# Patient Record
Sex: Female | Born: 1960 | Race: White | Hispanic: No | Smoking: Current every day smoker
Health system: Southern US, Community
[De-identification: ages and names within clinical notes are randomized; demographics above are authoritative.]

## PROBLEM LIST (undated history)

## (undated) DIAGNOSIS — M1990 Unspecified osteoarthritis, unspecified site: Secondary | ICD-10-CM

## (undated) DIAGNOSIS — F329 Major depressive disorder, single episode, unspecified: Secondary | ICD-10-CM

## (undated) DIAGNOSIS — I82409 Acute embolism and thrombosis of unspecified deep veins of unspecified lower extremity: Secondary | ICD-10-CM

## (undated) DIAGNOSIS — E785 Hyperlipidemia, unspecified: Secondary | ICD-10-CM

## (undated) DIAGNOSIS — K259 Gastric ulcer, unspecified as acute or chronic, without hemorrhage or perforation: Secondary | ICD-10-CM

## (undated) DIAGNOSIS — F419 Anxiety disorder, unspecified: Secondary | ICD-10-CM

## (undated) DIAGNOSIS — N200 Calculus of kidney: Secondary | ICD-10-CM

## (undated) DIAGNOSIS — K219 Gastro-esophageal reflux disease without esophagitis: Secondary | ICD-10-CM

## (undated) DIAGNOSIS — I10 Essential (primary) hypertension: Secondary | ICD-10-CM

## (undated) DIAGNOSIS — F32A Depression, unspecified: Secondary | ICD-10-CM

## (undated) HISTORY — DX: Gastric ulcer, unspecified as acute or chronic, without hemorrhage or perforation: K25.9

## (undated) HISTORY — PX: ABDOMINAL HYSTERECTOMY: SHX81

## (undated) HISTORY — DX: Unspecified osteoarthritis, unspecified site: M19.90

## (undated) HISTORY — PX: LITHOTRIPSY: SUR834

## (undated) HISTORY — DX: Hyperlipidemia, unspecified: E78.5

## (undated) HISTORY — DX: Major depressive disorder, single episode, unspecified: F32.9

## (undated) HISTORY — DX: Gastro-esophageal reflux disease without esophagitis: K21.9

## (undated) HISTORY — DX: Depression, unspecified: F32.A

## (undated) HISTORY — PX: KIDNEY SURGERY: SHX687

## (undated) HISTORY — DX: Anxiety disorder, unspecified: F41.9

---

## 2014-02-19 DIAGNOSIS — Z7901 Long term (current) use of anticoagulants: Secondary | ICD-10-CM | POA: Insufficient documentation

## 2014-02-19 DIAGNOSIS — I82409 Acute embolism and thrombosis of unspecified deep veins of unspecified lower extremity: Secondary | ICD-10-CM | POA: Insufficient documentation

## 2014-02-19 DIAGNOSIS — O2230 Deep phlebothrombosis in pregnancy, unspecified trimester: Principal | ICD-10-CM

## 2014-02-19 DIAGNOSIS — Z87442 Personal history of urinary calculi: Secondary | ICD-10-CM | POA: Insufficient documentation

## 2014-02-19 DIAGNOSIS — Z79899 Other long term (current) drug therapy: Secondary | ICD-10-CM | POA: Insufficient documentation

## 2014-02-19 DIAGNOSIS — O99330 Smoking (tobacco) complicating pregnancy, unspecified trimester: Secondary | ICD-10-CM | POA: Insufficient documentation

## 2014-02-19 DIAGNOSIS — O169 Unspecified maternal hypertension, unspecified trimester: Secondary | ICD-10-CM | POA: Insufficient documentation

## 2014-02-20 ENCOUNTER — Ambulatory Visit (HOSPITAL_COMMUNITY): Payer: Medicare Other | Attending: Emergency Medicine

## 2014-02-20 ENCOUNTER — Encounter (HOSPITAL_COMMUNITY): Payer: Self-pay | Admitting: Emergency Medicine

## 2014-02-20 ENCOUNTER — Emergency Department (HOSPITAL_COMMUNITY)
Admission: EM | Admit: 2014-02-20 | Discharge: 2014-02-20 | Disposition: A | Discharge status: Home / Self Care | Payer: Medicare Other | Attending: Emergency Medicine | Admitting: Emergency Medicine

## 2014-02-20 DIAGNOSIS — O2230 Deep phlebothrombosis in pregnancy, unspecified trimester: Secondary | ICD-10-CM

## 2014-02-20 HISTORY — DX: Calculus of kidney: N20.0

## 2014-02-20 HISTORY — DX: Acute embolism and thrombosis of unspecified deep veins of unspecified lower extremity: I82.409

## 2014-02-20 HISTORY — DX: Essential (primary) hypertension: I10

## 2014-02-20 LAB — CBC WITH DIFFERENTIAL/PLATELET
Basophils Absolute: 0.1 10*3/uL (ref 0.0–0.1)
Basophils Relative: 1 % (ref 0–1)
EOS PCT: 3 % (ref 0–5)
Eosinophils Absolute: 0.2 10*3/uL (ref 0.0–0.7)
HCT: 39.7 % (ref 36.0–46.0)
Hemoglobin: 13 g/dL (ref 12.0–15.0)
LYMPHS PCT: 30 % (ref 12–46)
Lymphs Abs: 2.6 10*3/uL (ref 0.7–4.0)
MCH: 29.3 pg (ref 26.0–34.0)
MCHC: 32.7 g/dL (ref 30.0–36.0)
MCV: 89.6 fL (ref 78.0–100.0)
Monocytes Absolute: 0.7 10*3/uL (ref 0.1–1.0)
Monocytes Relative: 8 % (ref 3–12)
NEUTROS ABS: 5 10*3/uL (ref 1.7–7.7)
Neutrophils Relative %: 58 % (ref 43–77)
PLATELETS: 188 10*3/uL (ref 150–400)
RBC: 4.43 MIL/uL (ref 3.87–5.11)
RDW: 13.4 % (ref 11.5–15.5)
WBC: 8.5 10*3/uL (ref 4.0–10.5)

## 2014-02-20 LAB — COMPREHENSIVE METABOLIC PANEL
ALT: 19 U/L (ref 0–35)
AST: 22 U/L (ref 0–37)
Albumin: 3.6 g/dL (ref 3.5–5.2)
Alkaline Phosphatase: 69 U/L (ref 39–117)
BUN: 15 mg/dL (ref 6–23)
CALCIUM: 9.1 mg/dL (ref 8.4–10.5)
CHLORIDE: 101 meq/L (ref 96–112)
CO2: 25 meq/L (ref 19–32)
Creatinine, Ser: 0.92 mg/dL (ref 0.50–1.10)
GFR calc Af Amer: 81 mL/min — ABNORMAL LOW (ref >90)
GFR, EST NON AFRICAN AMERICAN: 70 mL/min — AB (ref >90)
Glucose, Bld: 101 mg/dL — ABNORMAL HIGH (ref 70–99)
POTASSIUM: 4.1 meq/L (ref 3.7–5.3)
SODIUM: 138 meq/L (ref 137–147)
Total Bilirubin: <0.2 mg/dL — ABNORMAL LOW (ref 0.3–1.2)
Total Protein: 6.7 g/dL (ref 6.0–8.3)

## 2014-02-20 LAB — PROTIME-INR
INR: 2.2 — ABNORMAL HIGH (ref 0.00–1.49)
Prothrombin Time: 23.7 seconds — ABNORMAL HIGH (ref 11.6–15.2)

## 2014-02-20 MED ORDER — OXYCODONE-ACETAMINOPHEN 5-325 MG PO TABS: 1.0000 | ORAL_TABLET | ORAL | Status: DC | PRN

## 2014-02-20 MED ORDER — ENOXAPARIN SODIUM 60 MG/0.6ML ~~LOC~~ SOLN
60.0000 mg | Freq: Once | SUBCUTANEOUS | Status: AC
  Administered 2014-02-20: 60 mg via SUBCUTANEOUS
  Filled 2014-02-20: qty 0.6

## 2014-02-20 MED ORDER — ENOXAPARIN SODIUM 30 MG/0.3ML ~~LOC~~ SOLN: 60.0000 mg | Freq: Two times a day (BID) | SUBCUTANEOUS | Status: DC

## 2014-02-20 MED ORDER — OXYCODONE-ACETAMINOPHEN 5-325 MG PO TABS
1.0000 | ORAL_TABLET | Freq: Once | ORAL | Status: AC
  Administered 2014-02-20: 1 via ORAL

## 2014-02-20 NOTE — ED Notes (Signed)
Pt. 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 Respirations unlabored .

## 2014-02-20 NOTE — ED Notes (Signed)
Pt discharged.Vital signs stable and GCS 15 

## 2014-02-20 NOTE — Discharge Instructions (Signed)
Deep Vein Thrombosis A deep vein thrombosis (DVT) is a blood clot that develops in the deep, larger veins of the leg, arm, or pelvis. These are more dangerous than clots that might form in veins near the surface of the body. A DVT can lead to complications if the clot breaks off and travels in the bloodstream to the lungs.  A DVT can damage the valves in your leg veins, so that instead of flowing upward, the blood pools in the lower leg. This is called post-thrombotic syndrome, and it can result in pain, swelling, discoloration, and sores on the leg. CAUSES Usually, several things contribute to blood clots forming. Contributing factors include:  The flow of blood slows down.  The inside of the vein is damaged in some way.  You have a condition that makes blood clot more easily. RISK FACTORS Some people are more likely than others to develop blood clots. Risk factors include:   Older age, especially over 53 years of age.  Having a family history of blood clots or if you have already had a blot clot.  Having major or lengthy surgery. This is especially true for surgery on the hip, knee, or belly (abdomen). Hip surgery is particularly high risk.  Breaking a hip or leg.  Sitting or lying still for a long time. This includes long-distance travel, paralysis, or recovery from an illness or surgery.  Having cancer or cancer treatment.  Having a long, thin tube (catheter) placed inside a vein during a medical procedure.  Being overweight (obese).  Pregnancy and childbirth.  Hormone changes make the blood clot more easily during pregnancy.  The fetus puts pressure on the veins of the pelvis.  There is a risk of injury to veins during delivery or a caesarean. The risk is highest just after childbirth.  Medicines with the female hormone estrogen. This includes birth control pills and hormone replacement therapy.  Smoking.  Other circulation or heart problems.  SIGNS AND SYMPTOMS When  a clot forms, it can either partially or totally block the blood flow in that vein. Symptoms of a DVT can include:  Swelling of the leg or arm, especially if one side is much worse.  Warmth and redness of the leg or arm, especially if one side is much worse.  Pain in an arm or leg. If the clot is in the leg, symptoms may be more noticeable or worse when standing or walking. The symptoms of a DVT that has traveled to the lungs (pulmonary embolism, PE) usually start suddenly and include:  Shortness of breath.  Coughing.  Coughing up blood or blood-tinged phlegm.  Chest pain. The chest pain is often worse with deep breaths.  Rapid heartbeat. Anyone with these symptoms should get emergency medical treatment right away. Call your local emergency services (911 in the [place] if you have these symptoms. DIAGNOSIS If a DVT is suspected, your health care provider will take a full medical history and perform a physical exam. Tests that also may be required include:  Blood tests, including studies of the clotting properties of the blood.  Ultrasonography to see if you have clots in your legs or lungs.  X-rays to show the flow of blood when dye is injected into the veins (venography).  Studies of your lungs if you have any chest symptoms. PREVENTION  Exercise the legs regularly. Take a brisk 30-minute walk every day.  Maintain a weight that is appropriate for your height.  Avoid sitting or lying in bed  for long periods of time without moving your legs.  Women, particularly those over the age of 24 years, should consider the risks and benefits of taking estrogen medicines, including birth control pills.  Do not smoke, especially if you take estrogen medicines.  Long-distance travel can increase your risk of DVT. You should exercise your legs by walking or pumping the muscles every hour.  In-hospital prevention:  Many of the risk factors above relate to situations that exist with  hospitalization, either for illness, injury, or elective surgery.  Your health care provider will assess you for the need for venous thromboembolism prophylaxis when you are admitted to the hospital. If you are having surgery, your surgeon will assess you the day of or day after surgery.  Prevention may include medical and nonmedical measures. TREATMENT Once identified, a DVT can be treated. It can also be prevented in some circumstances. Once you have had a DVT, you may be at increased risk for a DVT in the future. The most common treatment for DVT is blood thinning (anticoagulant) medicine, which reduces the blood's tendency to clot. Anticoagulants can stop new blood clots from forming and stop old ones from growing. They cannot dissolve existing clots. Your body does this by itself over time. Anticoagulants can be given by mouth, by IV access, or by injection. Your health care provider will determine the best program for you. Other medicines or treatments that may be used are:  Heparin or related medicines (low molecular weight heparin) are usually the first treatment for a blood clot. They act quickly. However, they cannot be taken orally.  Heparin can cause a fall in a component of blood that stops bleeding and forms blood clots (platelets). You will be monitored with blood tests to be sure this does not occur.  Warfarin is an anticoagulant that can be swallowed. It takes a few days to start working, so usually heparin or related medicines are used in combination. Once warfarin is working, heparin is usually stopped.  Less commonly, clot dissolving drugs (thrombolytics) are used to dissolve a DVT. They carry a high risk of bleeding, so they are used mainly in severe cases, where your life or a limb is threatened.  Very rarely, a blood clot in the leg needs to be removed surgically.  If you are unable to take anticoagulants, your health care provider may arrange for you to have a filter placed  in a main vein in your abdomen. This filter prevents clots from traveling to your lungs. HOME CARE INSTRUCTIONS  Take all medicines prescribed by your health care provider. Only take over-the-counter or prescription medicines for pain, fever, or discomfort as directed by your health care provider.  Warfarin. Most people will continue taking warfarin after hospital discharge. Your health care provider will advise you on the length of treatment (usually 3 6 months, sometimes lifelong).  Too much and too little warfarin are both dangerous. Too much warfarin increases the risk of bleeding. Too little warfarin continues to allow the risk for blood clots. While taking warfarin, you will need to have regular blood tests to measure your blood clotting time. These blood tests usually include both the prothrombin time (PT) and international normalized ratio (INR) tests. The PT and INR results allow your health care provider to adjust your dose of warfarin. The dose can change for many reasons. It is critically important that you take warfarin exactly as prescribed, and that you have your PT and INR levels drawn exactly as directed.  Many foods, especially foods high in vitamin K, can interfere with warfarin and affect the PT and INR results. Foods high in vitamin K include spinach, kale, broccoli, cabbage, collard and turnip greens, brussel sprouts, peas, cauliflower, seaweed, and parsley as well as beef and pork liver, green tea, and soybean oil. You should eat a consistent amount of foods high in vitamin K. Avoid major changes in your diet, or notify your health care provider before changing your diet. Arrange a visit with a dietitian to answer your questions.  Many medicines can interfere with warfarin and affect the PT and INR results. You must tell your health care provider about any and all medicines you take. This includes all vitamins and supplements. Be especially cautious with aspirin and  anti-inflammatory medicines. Ask your health care provider before taking these. Do not take or discontinue any prescribed or over-the-counter medicine except on the advice of your health care provider or pharmacist.  Warfarin can have side effects, primarily excessive bruising or bleeding. You will need to hold pressure over cuts for longer than usual. Your health care provider or pharmacist will discuss other potential side effects.  Alcohol can change the body's ability to handle warfarin. It is best to avoid alcoholic drinks or consume only very small amounts while taking warfarin. Notify your health care provider if you change your alcohol intake.  Notify your dentist or other health care providers before procedures.  Activity. Ask your health care provider how soon you can go back to normal activities. It is important to stay active to prevent blood clots. If you are on anticoagulant medicine, avoid contact sports.  Exercise. It is very important to exercise. This is especially important while traveling, sitting, or standing for long periods of time. Exercise your legs by walking or by pumping the muscles frequently. Take frequent walks.  Compression stockings. These are tight elastic stockings that apply pressure to the lower legs. This pressure can help keep the blood in the legs from clotting. You may need to wear compression stockings at home to help prevent a DVT.  Do not smoke. If you smoke, quit. Ask your health care provider for help with quitting smoking.  Learn as much as you can about DVT. Knowing more about the condition should help you keep it from coming back.  Wear a medical alert bracelet or carry a medical alert card. SEEK MEDICAL CARE IF:  You notice a rapid heartbeat.  You feel weaker or more tired than usual.  You feel faint.  You notice increased bruising.  You feel your symptoms are not getting better in the time expected.  You believe you are having side  effects of medicine. SEEK IMMEDIATE MEDICAL CARE IF:  You have chest pain.  You have trouble breathing.  You have new or increased swelling or pain in one leg.  You cough up blood.  You notice blood in vomit, in a bowel movement, or in urine. MAKE SURE YOU:  Understand these instructions.  Will watch your condition.  Will get help right away if you are not doing well or get worse. Document Released: 11/25/2005 Document Revised: 09/15/2013 Document Reviewed: 08/02/2013 [facility] Patient Information 2014 [place], [place].  Venous Thromboembolism Venous thromboembolism (VTE) is a condition where a blood clot (thrombus) develops in the body. A thrombus usually occurs in a deep vein in the leg or pelvis but can occur in an upper extremity. Sometimes pieces of the thrombus can break off from its original place of development  and travel through the bloodstream to other parts of the body. When a thrombus breaks off and travels through the bloodstream, it is called an embolism. The embolism can block the blood flow in the blood vessels of other organs. There are two two serious types of VTE:  Deep vein thrombosis (DVT). A DVT is a thrombus that usually occurs in a deep vein of the lower legs, pelvis, or in an upper extremity.  Pulmonary embolism (PE). A PE occurs when an embolism has formed and traveled to the lungs. A PE can block or decrease the blood flow in one or both lungs.  Venous thromboembolism is a serious health condition that can cause disability or death. It is very important to not ignore symptoms or delay treatment.  CAUSES   A blood clot can form in a vein from different conditions. A blood clot can develop due to:  Blood flow within a vein that is sluggish or very slow.  Medical conditions that make the blood clot easily.  Vein damage. RISK FACTORS Risk factors can increase your risk of developing a blood clot. Risk factors can  include:  Smoking.  Obesity.  Age.  Immobility or sedentary lifestyle.  Sitting or standing for long periods of time.  Chronic or long-term bedrest.  Medical or past history of blood clots.  Family history of blood clots.  Hip, leg, or pelvis injury or trauma.  Major surgery, especially surgery on the hip, knee, or abdomen.  Pregnancy and childbirth.  Birth control pills and hormone replacement therapy.  Medical conditions such as  Peripheral vascular disease (PVD).  Diabetes.  Cancer. SYMPTOMS  Symptoms of VTE can depend on where the clot is located and if the clot breaks off and travels to another organ. Sometimes, there may be no symptoms.   DVT symptoms can include:  Swelling of the leg or arm, especially on one side.  Warmth and redness of the leg or arm, especially on one side.  Pain in an arm or leg. Leg pain may be more noticeable or worse when standing or walking.  PE symptoms can include:  Shortness of breath.  Coughing.  Coughing up blood or blood-tinged mucus (hemoptysis).  Chest pain or chest pain with deep breaths (pleuritic chest pain).  Apprehension, anxiety, or a feeling of impending doom.  Rapid heartbeat. A PE is a medical emergency. Call your local emergency services (911 in [place] if you have these symptoms. DIAGNOSIS  A venous thromboembolism is diagnosed by:  Looking at your medical history and risk factors. Your caregiver will perform a physical exam.  Blood tests, including blood work of how your blood clots.  Imaging tests that can detect a blood clot may be ordered. These can include:  Ultrasonography.  Computed Tomography (CT) scan.  Magnetic Resonance Imaging (MRI).  Echocardiography.  Electrocardiography. TREATMENT  Initial treatment: When a venous thromboembolism has been confirmed, initial treatment consists of using blood thinning (anticoagulant) medicines. Anticoagulant medicines affect how your blood clots  and can cause bleeding. Therefore, your blood clotting times are monitored by blood tests called prothrombin time (PT) and International Normalized Ratio (INR) when you are on anticoagulants. Typically, the anticoagulants are intravenous (IV) heparin and warfarin. IV heparin is normally started right away because IV heparin has a rapid onset of action and thins the blood quickly. Warfarin is also started with IV heparin therapy. Warfarin has a slower onset of action and takes longer to work. This overlap therapy of IV heparin and oral  warfarin is continued until PT and INR levels are therapeutic. After the PT and INR levels are therapeutic, IV heparin is discontinued and you are maintained on warfarin.  Other treatment options:  Catheter-directed thrombolysis. This is a clot-busting therapy for a DVT in which a small, flexible hollow tube (catheter) is threaded to the blood clot inside the vein. A clot-busting drug (thrombolytic) is then infused through the catheter. The thrombolytic helps to break up the clot in the vein and restore blood flow.  Direct thrombin inhibitor (DTI) medicine. A DTI is an anticoagulant that slows blood clotting. It is given through an IV.  If you cannot take an anticoagulant, a filter called an inferior vena cava filter (IVC filter) can be placed. The IVC filter is placed in a large vein, in either your leg or abdomen. An IVC filter is left in the vein permanently. The IVC filter can help prevent blood clots from going to your lungs.  Surgery. Blood clots may need to be removed surgically if other treatment options are not working or cannot be used. Types of surgery can include:  Thrombectomy.  Embolectomy.  Venous stenting.  Pain medicine (analgesic). Medicine to control pain is given in addition to the above treatment options. Home treatment:  Continued treatment at home consists of taking either warfarin or under-the-skin (subcutaneous) injections of an  anticoagulant. PREVENTION   In-hospital prevention:  Activity. Getting out of bed and walking while you are in the hospital can help prevent blood clots.  Medicines may be given to help prevent blood clots.  Sequential compression device (SCD). A SCD can help prevent blood clots in the lower legs. A compression sleeve is wrapped around each of your legs. The tubing of the sleeve is connected to a machine that pumps air into the compression sleeve. The pumping action of the sleeve helps circulate the blood in your legs.  Compression stockings. Compression stockings are tight, elastic stockings that apply pressure to the lower legs and help prevent blood from pooling in the lower legs. Compression stockings are sometimes used with SCDs.  General prevention:  Exercise regularly if you are able.  Avoid sitting or lying in bed for long periods of time without moving the legs.  Do not smoke. If you smoke, quit. Ask your caregiver for help.  Avoid exposure to smoke.  Maintain a healthy weight.  Women over the age of 63 should consider the risk of blood clots while taking birth control pills or hormone replacement therapy.  Long distance travel along with prolonged sitting and standing can increase the risk of a DVT. Exercise your legs by walking or by pumping your leg muscles every hour. HOME CARE INSTRUCTIONS   Take all medicines prescribed by your caregiver. Follow the directions carefully.  Warfarin. Most people will continue taking warfarin. Your caregiver will advise you on the length of treatment (usually 3 to 6 months, sometimes lifelong).  Too much and too little warfarin are both dangerous. Too much warfarin increases the risk of bleeding. Too little warfarin continues to allow the risk for blood clots. While taking warfarin, you will need to have regular blood tests to measure your blood clotting time. These blood tests usually include both the PT and INR tests. The PT and INR  results allow your caregiver to adjust your dose of warfarin. The dose can change for many reasons. It is critically important that you take warfarin exactly as prescribed, and that you have your PT and INR levels drawn exactly  as directed.  Many foods, especially foods high in vitamin K can interfere with warfarin and affect the PT and INR results. Foods high in vitamin K include spinach, kale, broccoli, cabbage, collard and turnip greens, brussels sprouts, peas, cauliflower, seaweed, and parsley as well as beef and pork liver, green tea, and soybean oil. You should eat a consistent amount of foods high in vitamin K. Avoid major changes in your diet, or notify your caregiver before changing your diet. Arrange a visit with a dietitian to answer your questions.  Many medicines can interfere with warfarin and affect the PT and INR results. You must tell your caregiver about any and all medicines you take, this includes all vitamins and supplements. Be especially cautious with aspirin and anti-inflammatory medicines. Do not take or discontinue any prescribed or over-the-counter medicine except on the advice of your caregiver or pharmacist.  Warfarin can have side effects, such as excessive bruising or bleeding. You will need to hold pressure over cuts for longer than usual. Your caregiver or pharmacist will discuss other potential side effects.  Avoid sports or activities that may cause injury or bleeding.  Be mindful when shaving, flossing your teeth, or handling sharp objects.  Alcohol can change the body's ability to handle warfarin. It is best to avoid alcoholic drinks or consume only very small amounts while taking warfarin. Notify your caregiver if you change your alcohol intake.  Notify your dentist or other caregivers before procedures.  Activity. Ask your caregiver how soon you can go back to normal activities if you have had a blood blot.  Exercise. It is very important to exercise and stay  active to prevent future blood clots. This is especially important while traveling, sitting, or standing for long periods of time. Exercise your legs by walking or by pumping the muscles frequently.  Compression stockings. You may need to wear compression stockings to help prevent a DVT.  Smoking. If you smoke, quit. Ask your caregiver for help with quitting smoking.  Learn as much as you can about VTE. Educating yourself can help prevent VTE from reoccurring.  Wear a medical alert bracelet or carry a medical alert card. SEEK MEDICAL CARE IF:   You feel faint or dizzy.  You feel rapid or skipped heartbeats.  You feel weaker or more tired than usual.  You feel you are not getting better in the time expected.  You believe you are having side effects from medicine.  You have joint pain.  You have abdominal pain.  You have new or increased bruising. SEEK IMMEDIATE MEDICAL CARE IF:   You vomit bright red blood or your vomit has a coffee ground type appearance.  You have bowel movements that have bright red blood or are dark or tarry in appearance.  You have bleeding from your rectum.  You have pink or bloody urine.  You develop breathing problems such as shortness of breath or pain with breathing.  You are coughing up blood.  You develop warmth, swelling, or redness in an arm or a leg.  You have chest pain.  You have a sudden, unexplained severe headache.  You have a cut that does not stop bleeding after 10 minutes.  You have a nosebleed that does not stop bleeding after 10 minutes. Document Released: 09/22/2009 Document Revised: 05/26/2012 Document Reviewed: 05/06/2012 [facility] Patient Information 2014 [place], [place].

## 2014-02-20 NOTE — ED Provider Notes (Addendum)
CSN: [id number]     Arrival date & time 02/19/14  2354 History   First MD Initiated Contact with Patient 02/20/14 0034     Chief Complaint  Patient presents with  . DVT     (Consider location/radiation/quality/duration/timing/severity/associated sxs/prior Treatment) HPI Comments: Pt with known hx of DVT, s/p ivc filter on the left / affected side, comes in with cc of leg swelling and pain. Pt has hx of coagulopathy. She comes in due to increased leg pain x 1 day. Pt states that her leg feels tight, and more swollen than usual. She is new to [place] x 1 month, and has no PCP here. [facility]. Reports that in the past she has required lovenox in addition to pain control and her comadin for active flare ups. Diagnoses with DVT when she was 16. No numbness, tingling, weakness at this time.  The history is provided by the patient.    Past Medical History  Diagnosis Date  . DVT (deep venous thrombosis)   . Hypertension   . Kidney stones    Past Surgical History  Procedure Laterality Date  . Lithotripsy    . Abdominal hysterectomy    . Kidney surgery     No family history on file. History  Substance Use Topics  . Smoking status: Current Every Day Smoker  . Smokeless tobacco: Not on file  . Alcohol Use: No   OB History   Grav Para Term Preterm Abortions TAB SAB Ect Mult Living                 Review of Systems  Constitutional: Positive for activity change.  Respiratory: Negative for shortness of breath.   Cardiovascular: Negative for chest pain.  Gastrointestinal: Negative for nausea, vomiting and abdominal pain.  Genitourinary: Negative for dysuria.  Musculoskeletal: Positive for myalgias. Negative for neck pain.  Skin: Positive for color change.  Neurological: Negative for headaches.      Allergies  Toradol  Home Medications   Current Outpatient Rx  Name  Route  Sig  Dispense  Refill  . lisinopril (PRINIVIL,ZESTRIL) 20 MG tablet   Oral   Take 20 mg by mouth  daily.         Marland Kitchen. omeprazole (PRILOSEC) 20 MG capsule   Oral   Take 20 mg by mouth daily.         Marland Kitchen. warfarin (COUMADIN) 5 MG tablet   Oral   Take 5 mg by mouth daily at 6 PM.          BP 159/79  Pulse 80  Temp(Src) 98.9 F (37.2 C) (Oral)  Resp 14  Ht 5\' 3"  (1.6 m)  Wt 133 lb (60.328 kg)  BMI 23.57 kg/m2  SpO2 98% Physical Exam  Nursing note and vitals reviewed. Constitutional: She is oriented to person, place, and time. She appears well-developed and well-nourished.  HENT:  Head: Normocephalic and atraumatic.  Eyes: EOM are normal. Pupils are equal, round, and reactive to light.  Neck: Neck supple.  Cardiovascular: Normal rate, regular rhythm and normal heart sounds.   No murmur heard. Pulmonary/Chest: Effort normal. No respiratory distress.  Abdominal: Soft. She exhibits no distension. There is no tenderness. There is no rebound and no guarding.  Musculoskeletal:  LLE unilateral swelling. Skin is pale compared to the contralateral side, however, warm. Pulse is 1+ on the LLE DP compared to the RLE DP (2+). Sensory exam - able to discriminate between sharp and dull, and is equal bilaterally.  Neurological: She is alert and oriented to person, place, and time.  Skin: Skin is warm and dry.    ED Course  Procedures (including critical care time) Labs Review Labs Reviewed  COMPREHENSIVE METABOLIC PANEL - Abnormal; Notable for the following:    Glucose, Bld 101 (*)    Total Bilirubin <0.2 (*)    GFR calc non Af Amer 70 (*)    GFR calc Af Amer 81 (*)    All other components within normal limits  PROTIME-INR - Abnormal; Notable for the following:    Prothrombin Time 23.7 (*)    INR 2.20 (*)    All other components within normal limits  CBC WITH DIFFERENTIAL   Imaging Review No results found.   EKG Interpretation None      MDM   Final diagnoses:  DVT (deep vein thrombosis) in pregnancy   Pt with hx of DVT, comes in with cc of leg swelling. No dib,  chest pain, palpitations. She has an IVC filter in place. Currently, no signs of infection, or acute complication of DVT  Will start her on lovenox, and get an outpatient [place] and numerous outpatient follow up for primary care management.  Pt is agreeable with the plan, and states that she has had same outpatient treatment options in the past.  Derwood Kaplan, MD 02/20/14 1610  Derwood Kaplan, MD 02/20/14 9604

## 2014-02-25 ENCOUNTER — Ambulatory Visit (INDEPENDENT_AMBULATORY_CARE_PROVIDER_SITE_OTHER): Payer: Medicare Other | Admitting: Family

## 2014-02-25 ENCOUNTER — Encounter: Payer: Self-pay | Admitting: Family

## 2014-02-25 VITALS — BP 168/94 | HR 89 | Ht 62.0 in | Wt 132.0 lb

## 2014-02-25 DIAGNOSIS — G8929 Other chronic pain: Secondary | ICD-10-CM

## 2014-02-25 DIAGNOSIS — Z1589 Genetic susceptibility to other disease: Secondary | ICD-10-CM | POA: Insufficient documentation

## 2014-02-25 DIAGNOSIS — E7212 Methylenetetrahydrofolate reductase deficiency: Secondary | ICD-10-CM | POA: Insufficient documentation

## 2014-02-25 DIAGNOSIS — I82509 Chronic embolism and thrombosis of unspecified deep veins of unspecified lower extremity: Secondary | ICD-10-CM

## 2014-02-25 DIAGNOSIS — M79606 Pain in leg, unspecified: Secondary | ICD-10-CM

## 2014-02-25 DIAGNOSIS — IMO0001 Reserved for inherently not codable concepts without codable children: Secondary | ICD-10-CM

## 2014-02-25 DIAGNOSIS — Z720 Tobacco use: Secondary | ICD-10-CM

## 2014-02-25 DIAGNOSIS — F17200 Nicotine dependence, unspecified, uncomplicated: Secondary | ICD-10-CM

## 2014-02-25 DIAGNOSIS — I10 Essential (primary) hypertension: Secondary | ICD-10-CM | POA: Insufficient documentation

## 2014-02-25 DIAGNOSIS — N200 Calculus of kidney: Secondary | ICD-10-CM

## 2014-02-25 DIAGNOSIS — E7210 Disorders of sulfur-bearing amino-acid metabolism, unspecified: Secondary | ICD-10-CM

## 2014-02-25 DIAGNOSIS — M79609 Pain in unspecified limb: Secondary | ICD-10-CM

## 2014-02-25 MED ORDER — RIVAROXABAN 20 MG PO TABS: 20.0000 mg | ORAL_TABLET | Freq: Every day | ORAL | Status: DC

## 2014-02-25 MED ORDER — LISINOPRIL 40 MG PO TABS: 40.0000 mg | ORAL_TABLET | Freq: Every day | ORAL | Status: DC

## 2014-02-25 MED ORDER — CYCLOBENZAPRINE HCL 10 MG PO TABS: 10.0000 mg | ORAL_TABLET | Freq: Three times a day (TID) | ORAL | Status: DC | PRN

## 2014-02-25 NOTE — Patient Instructions (Signed)
Smoking Cessation Quitting smoking is important to your health and has many advantages. However, it is not always easy to quit since nicotine is a very addictive drug. Often times, people try 3 times or more before being able to quit. This document explains the best ways for you to prepare to quit smoking. Quitting takes hard work and a lot of effort, but you can do it. ADVANTAGES OF QUITTING SMOKING  You will live longer, feel better, and live better.  Your body will feel the impact of quitting smoking almost immediately.  Within 20 minutes, blood pressure decreases. Your pulse returns to its normal level.  After 8 hours, carbon monoxide levels in the blood return to normal. Your oxygen level increases.  After 24 hours, the chance of having a heart attack starts to decrease. Your breath, hair, and body stop smelling like smoke.  After 48 hours, damaged nerve endings begin to recover. Your sense of taste and smell improve.  After 72 hours, the body is virtually free of nicotine. Your bronchial tubes relax and breathing becomes easier.  After 2 to 12 weeks, lungs can hold more air. Exercise becomes easier and circulation improves.  The risk of having a heart attack, stroke, cancer, or lung disease is greatly reduced.  After 1 year, the risk of coronary heart disease is cut in half.  After 5 years, the risk of stroke falls to the same as a nonsmoker.  After 10 years, the risk of lung cancer is cut in half and the risk of other cancers decreases significantly.  After 15 years, the risk of coronary heart disease drops, usually to the level of a nonsmoker.  If you are pregnant, quitting smoking will improve your chances of having a healthy baby.  The people you live with, especially any children, will be healthier.  You will have extra money to spend on things other than cigarettes. QUESTIONS TO THINK ABOUT BEFORE ATTEMPTING TO QUIT You may want to talk about your answers with your  caregiver.  Why do you want to quit?  If you tried to quit in the past, what helped and what did not?  What will be the most difficult situations for you after you quit? How will you plan to handle them?  Who can help you through the tough times? Your family? Friends? A caregiver?  What pleasures do you get from smoking? What ways can you still get pleasure if you quit? Here are some questions to ask your caregiver:  How can you help me to be successful at quitting?  What medicine do you think would be best for me and how should I take it?  What should I do if I need more help?  What is smoking withdrawal like? How can I get information on withdrawal? GET READY  Set a quit date.  Change your environment by getting rid of all cigarettes, ashtrays, matches, and lighters in your home, car, or work. Do not let people smoke in your home.  Review your past attempts to quit. Think about what worked and what did not. GET SUPPORT AND ENCOURAGEMENT You have a better chance of being successful if you have help. You can get support in many ways.  Tell your family, friends, and co-workers that you are going to quit and need their support. Ask them not to smoke around you.  Get individual, group, or telephone counseling and support. Programs are available at local hospitals and health centers. Call your local health department for   information about programs in your area.  Spiritual beliefs and practices may help some smokers quit.  Download a "quit meter" on your computer to keep track of quit statistics, such as how long you have gone without smoking, cigarettes not smoked, and money saved.  Get a self-help book about quitting smoking and staying off of tobacco. LEARN NEW SKILLS AND BEHAVIORS  Distract yourself from urges to smoke. Talk to someone, go for a walk, or occupy your time with a task.  Change your normal routine. Take a different route to work. Drink tea instead of coffee.  Eat breakfast in a different place.  Reduce your stress. Take a hot bath, exercise, or read a book.  Plan something enjoyable to do every day. Reward yourself for not smoking.  Explore interactive web-based programs that specialize in helping you quit. GET MEDICINE AND USE IT CORRECTLY Medicines can help you stop smoking and decrease the urge to smoke. Combining medicine with the above behavioral methods and support can greatly increase your chances of successfully quitting smoking.  Nicotine replacement therapy helps deliver nicotine to your body without the negative effects and risks of smoking. Nicotine replacement therapy includes nicotine gum, lozenges, inhalers, nasal sprays, and skin patches. Some may be available over-the-counter and others require a prescription.  Antidepressant medicine helps people abstain from smoking, but how this works is unknown. This medicine is available by prescription.  Nicotinic receptor partial agonist medicine simulates the effect of nicotine in your brain. This medicine is available by prescription. Ask your caregiver for advice about which medicines to use and how to use them based on your health history. Your caregiver will tell you what side effects to look out for if you choose to be on a medicine or therapy. Carefully read the information on the package. Do not use any other product containing nicotine while using a nicotine replacement product.  RELAPSE OR DIFFICULT SITUATIONS Most relapses occur within the first 3 months after quitting. Do not be discouraged if you start smoking again. Remember, most people try several times before finally quitting. You may have symptoms of withdrawal because your body is used to nicotine. You may crave cigarettes, be irritable, feel very hungry, cough often, get headaches, or have difficulty concentrating. The withdrawal symptoms are only temporary. They are strongest when you first quit, but they will go away within  10 14 days. To reduce the chances of relapse, try to:  Avoid drinking alcohol. Drinking lowers your chances of successfully quitting.  Reduce the amount of caffeine you consume. Once you quit smoking, the amount of caffeine in your body increases and can give you symptoms, such as a rapid heartbeat, sweating, and anxiety.  Avoid smokers because they can make you want to smoke.  Do not let weight gain distract you. Many smokers will gain weight when they quit, usually less than 10 pounds. Eat a healthy diet and stay active. You can always lose the weight gained after you quit.  Find ways to improve your mood other than smoking. FOR MORE INFORMATION  www.smokefree.gov  Document Released: 11/19/2001 Document Revised: 05/26/2012 Document Reviewed: 03/05/2012 ExitCare Patient Information 2014 [employer].  

## 2014-02-25 NOTE — Progress Notes (Signed)
Subjective:    Patient ID: Alexandra Navarro, female    DOB: 1961-07-20, 53 y.o.   MRN: [id number]  HPI [age] year WF, smoker, new patient to the practice is in today to be established. She was seen in the ED over the weekend after developing a DVT in her left lower leg and is currently on Coumadin. She has had 8 DVTs in the past on Coumadin. She has known MTHFR mutation that was diagnosed in 2005. She has chronic left leg pain from DVTs and takes Flexeril 3 times a day. She is requesting a referral to the pain clinic. Also requests a referral to urology for recurrent renal calculi. He reports having over 80 kidney stones.  Review of Systems  Constitutional: Negative.   HENT: Negative.   Respiratory: Negative.   Cardiovascular: Negative.   Gastrointestinal: Negative.   Endocrine: Negative.   Genitourinary: Negative.   Musculoskeletal: Positive for arthralgias and myalgias.       Left leg pain and cramping  Skin: Negative.   Allergic/Immunologic: Negative.   Neurological: Negative.   Hematological: Negative.   Psychiatric/Behavioral: Negative.    Past Medical History  Diagnosis Date  . DVT (deep venous thrombosis)   . Hypertension   . Kidney stones     History   Social History  . Marital Status: Divorced    Spouse Name: N/A    Number of Children: N/A  . Years of Education: N/A   Occupational History  . Not on file.   Social History Main Topics  . Smoking status: Current Every Day Smoker  . Smokeless tobacco: Not on file  . Alcohol Use: No  . Drug Use: No  . Sexual Activity: Not on file   Other Topics Concern  . Not on file   Social History Narrative  . No narrative on file    Past Surgical History  Procedure Laterality Date  . Lithotripsy    . Abdominal hysterectomy    . Kidney surgery      Family History  Problem Relation Age of Onset  . Hyperlipidemia Mother   . Hypertension Mother   . Depression Mother   . Hyperlipidemia Father   . Hypertension  Father   . Heart disease Father   . Diabetes Father     Allergies  Allergen Reactions  . Toradol [Ketorolac Tromethamine] Other (See Comments)    Kidney damage-patient has only 1 kidney    Current Outpatient Prescriptions on File Prior to Visit  Medication Sig Dispense Refill  . omeprazole (PRILOSEC) 20 MG capsule Take 20 mg by mouth daily.       No current facility-administered medications on file prior to visit.    BP 168/94  Pulse 89  Ht 5\' 2"  (1.575 m)  Wt 132 lb (59.875 kg)  BMI 24.14 kg/m2  SpO2 98%chart    Objective:   Physical Exam  Constitutional: She is oriented to person, place, and time. She appears well-developed and well-nourished.  HENT:  Right Ear: External ear normal.  Left Ear: External ear normal.  Nose: Nose normal.  Mouth/Throat: Oropharynx is clear and moist.  Eyes: Conjunctivae and EOM are normal. Pupils are equal, round, and reactive to light.  Neck: Normal range of motion. Neck supple.  Cardiovascular: Normal rate, regular rhythm and normal heart sounds.   Pulmonary/Chest: Effort normal and breath sounds normal.  Abdominal: Soft. Bowel sounds are normal. She exhibits no distension. There is no tenderness.  Musculoskeletal: Normal range of motion. She exhibits tenderness.  She exhibits no edema.  Neurological: She is alert and oriented to person, place, and time. She has normal reflexes. She displays normal reflexes. No cranial nerve deficit. Coordination normal.  Skin: Skin is warm and dry.  Psychiatric: She has a normal mood and affect.          Assessment & Plan:  Alexandra Navarro was seen today for establish care.  Diagnoses and associated orders for this visit:  Unspecified essential hypertension  DVT, recurrent, lower extremity, chronic  Tobacco abuse  MTHFR mutation  Chronic leg pain - Ambulatory referral to Pain Clinic  Recurrent kidney stones - Ambulatory referral to Urology  Other Orders - lisinopril (PRINIVIL,ZESTRIL) 40 MG  tablet; Take 1 tablet (40 mg total) by mouth daily. - cyclobenzaprine (FLEXERIL) 10 MG tablet; Take 1 tablet (10 mg total) by mouth 3 (three) times daily as needed for muscle spasms. - Rivaroxaban (XARELTO) 20 MG TABS tablet; Take 1 tablet (20 mg total) by mouth daily with supper.   Recheck in 3 weeks and sooner as needed.

## 2014-02-25 NOTE — Progress Notes (Signed)
Pre visit review using our clinic review tool, if applicable. No additional management support is needed unless otherwise documented below in the visit note. 

## 2014-02-28 ENCOUNTER — Telehealth: Payer: Self-pay | Admitting: Family

## 2014-02-28 NOTE — Telephone Encounter (Signed)
Relevant patient education mailed to patient.  

## 2014-03-10 ENCOUNTER — Telehealth: Payer: Self-pay | Admitting: Family

## 2014-03-10 MED ORDER — RIVAROXABAN 20 MG PO TABS: 20.0000 mg | ORAL_TABLET | Freq: Every day | ORAL | Status: DC

## 2014-03-10 MED ORDER — LISINOPRIL 40 MG PO TABS: 40.0000 mg | ORAL_TABLET | Freq: Every day | ORAL | Status: DC

## 2014-03-10 MED ORDER — OMEPRAZOLE 20 MG PO CPDR: 20.0000 mg | DELAYED_RELEASE_CAPSULE | Freq: Every day | ORAL | Status: DC

## 2014-03-10 NOTE — Telephone Encounter (Signed)
Scripts resent

## 2014-03-10 NOTE — Telephone Encounter (Signed)
Pt was seen on 02/25/14 and Padonda informed her that she would call in her prescriptions, pt states her pharmacy did not receive the fax, pt is needing rx lisinopril (PRINIVIL,ZESTRIL) 40 MG tablet rivaroxaban ( xarelto) 20 mg  And omeprazole (prilosec) 20 mg, pt states she will be out of xarelto on tomorrow.please send to [facility].

## 2014-03-18 ENCOUNTER — Encounter: Payer: Self-pay | Admitting: Family

## 2014-03-18 ENCOUNTER — Ambulatory Visit (INDEPENDENT_AMBULATORY_CARE_PROVIDER_SITE_OTHER): Payer: Medicare Other | Admitting: Family

## 2014-03-18 VITALS — BP 134/84 | HR 85 | Wt 134.0 lb

## 2014-03-18 DIAGNOSIS — N200 Calculus of kidney: Secondary | ICD-10-CM

## 2014-03-18 DIAGNOSIS — I10 Essential (primary) hypertension: Secondary | ICD-10-CM

## 2014-03-18 DIAGNOSIS — R350 Frequency of micturition: Secondary | ICD-10-CM

## 2014-03-18 LAB — COMPREHENSIVE METABOLIC PANEL
ALT: 10 U/L (ref 0–35)
AST: 21 U/L (ref 0–37)
Albumin: 4 g/dL (ref 3.5–5.2)
Alkaline Phosphatase: 62 U/L (ref 39–117)
BUN: 12 mg/dL (ref 6–23)
CO2: 30 meq/L (ref 19–32)
CREATININE: 1.2 mg/dL (ref 0.4–1.2)
Calcium: 9.5 mg/dL (ref 8.4–10.5)
Chloride: 102 mEq/L (ref 96–112)
GFR: 51.77 mL/min — AB (ref >60.00)
Glucose, Bld: 94 mg/dL (ref 70–99)
Potassium: 4.7 mEq/L (ref 3.5–5.1)
SODIUM: 140 meq/L (ref 135–145)
TOTAL PROTEIN: 7 g/dL (ref 6.0–8.3)
Total Bilirubin: 0.5 mg/dL (ref 0.3–1.2)

## 2014-03-18 LAB — POCT URINALYSIS DIPSTICK
BILIRUBIN UA: NEGATIVE
Glucose, UA: NEGATIVE
KETONES UA: NEGATIVE
Nitrite, UA: NEGATIVE
PH UA: 5.5
Spec Grav, UA: >=1.03
Urobilinogen, UA: 0.2

## 2014-03-18 MED ORDER — CYCLOBENZAPRINE HCL 10 MG PO TABS
10.0000 mg | ORAL_TABLET | Freq: Three times a day (TID) | ORAL | Status: AC | PRN
Start: 2014-03-18 — End: ?

## 2014-03-18 MED ORDER — OMEPRAZOLE 20 MG PO CPDR: 20.0000 mg | DELAYED_RELEASE_CAPSULE | Freq: Every day | ORAL | Status: AC

## 2014-03-18 MED ORDER — RIVAROXABAN 20 MG PO TABS: 20.0000 mg | ORAL_TABLET | Freq: Every day | ORAL | Status: DC

## 2014-03-18 MED ORDER — LISINOPRIL 40 MG PO TABS: 40.0000 mg | ORAL_TABLET | Freq: Every day | ORAL | Status: AC

## 2014-03-18 MED ORDER — HYDROCODONE-ACETAMINOPHEN 10-325 MG PO TABS: 1.0000 | ORAL_TABLET | Freq: Three times a day (TID) | ORAL | Status: DC | PRN

## 2014-03-18 NOTE — Patient Instructions (Signed)
Kidney Stones  Kidney stones (urolithiasis) are deposits that form inside your kidneys. The intense pain is caused by the stone moving through the urinary tract. When the stone moves, the ureter goes into spasm around the stone. The stone is usually passed in the urine.   CAUSES   · A disorder that makes certain neck glands produce too much parathyroid hormone (primary hyperparathyroidism).  · A buildup of uric acid crystals, similar to gout in your joints.  · Narrowing (stricture) of the ureter.  · A kidney obstruction present at birth (congenital obstruction).  · Previous surgery on the kidney or ureters.  · Numerous kidney infections.  SYMPTOMS   · Feeling sick to your stomach (nauseous).  · Throwing up (vomiting).  · Blood in the urine (hematuria).  · Pain that usually spreads (radiates) to the groin.  · Frequency or urgency of urination.  DIAGNOSIS   · Taking a history and physical exam.  · Blood or urine tests.  · CT scan.  · Occasionally, an examination of the inside of the urinary bladder (cystoscopy) is performed.  TREATMENT   · Observation.  · Increasing your fluid intake.  · Extracorporeal shock wave lithotripsy This is a noninvasive procedure that uses shock waves to break up kidney stones.  · Surgery may be needed if you have severe pain or persistent obstruction. There are various surgical procedures. Most of the procedures are performed with the use of small instruments. Only small incisions are needed to accommodate these instruments, so recovery time is minimized.  The size, location, and chemical composition are all important variables that will determine the proper choice of action for you. Talk to your health care provider to better understand your situation so that you will minimize the risk of injury to yourself and your kidney.   HOME CARE INSTRUCTIONS   · Drink enough water and fluids to keep your urine clear or pale yellow. This will help you to pass the stone or stone fragments.  · Strain  all urine through the provided strainer. Keep all particulate matter and stones for your health care provider to see. The stone causing the pain may be as small as a grain of salt. It is very important to use the strainer each and every time you pass your urine. The collection of your stone will allow your health care provider to analyze it and verify that a stone has actually passed. The stone analysis will often identify what you can do to reduce the incidence of recurrences.  · Only take over-the-counter or prescription medicines for pain, discomfort, or fever as directed by your health care provider.  · Make a follow-up appointment with your health care provider as directed.  · Get follow-up X-rays if required. The absence of pain does not always mean that the stone has passed. It may have only stopped moving. If the urine remains completely obstructed, it can cause loss of kidney function or even complete destruction of the kidney. It is your responsibility to make sure X-rays and follow-ups are completed. Ultrasounds of the kidney can show blockages and the status of the kidney. Ultrasounds are not associated with any radiation and can be performed easily in a matter of minutes.  SEEK MEDICAL CARE IF:  · You experience pain that is progressive and unresponsive to any pain medicine you have been prescribed.  SEEK IMMEDIATE MEDICAL CARE IF:   · Pain cannot be controlled with the prescribed medicine.  · You have a fever   or shaking chills.  · The severity or intensity of pain increases over 18 hours and is not relieved by pain medicine.  · You develop a new onset of abdominal pain.  · You feel faint or pass out.  · You are unable to urinate.  MAKE SURE YOU:   · Understand these instructions.  · Will watch your condition.  · Will get help right away if you are not doing well or get worse.  Document Released: 11/25/2005 Document Revised: 07/28/2013 Document Reviewed: 04/28/2013  ExitCare® Patient Information ©2014  [employer].

## 2014-03-18 NOTE — Progress Notes (Signed)
Subjective:    Patient ID: Alexandra Navarro, female    DOB: 30-Apr-1961, 53 y.o.   MRN: [id number]  HPI  53 year old caucasian female,smoker presenting for a follow-up of HTN, DVT, and kidney stones.  She was seen on 02/25/14 after being hospitalized for a DVT.  She has been taking medications as prescribed.  She is complaining of pain in lower back on left side and blood in her urine and feels she may have another kidney stone. She has had numerous stones in the past.  She is tolerating Xarelto well.    Review of Systems  Constitutional: Negative.   HENT: Negative.   Respiratory: Negative.   Cardiovascular: Negative.   Gastrointestinal: Negative.   Genitourinary: Positive for hematuria.  Musculoskeletal: Positive for back pain.  Skin: Negative.   Neurological: Negative for dizziness and headaches.  Hematological: Negative.  Does not bruise/bleed easily.   Past Medical History  Diagnosis Date  . DVT (deep venous thrombosis)   . Hypertension   . Kidney stones     History   Social History  . Marital Status: Divorced    Spouse Name: N/A    Number of Children: N/A  . Years of Education: N/A   Occupational History  . Not on file.   Social History Main Topics  . Smoking status: Current Every Day Smoker  . Smokeless tobacco: Not on file  . Alcohol Use: No  . Drug Use: No  . Sexual Activity: Not on file   Other Topics Concern  . Not on file   Social History Narrative  . No narrative on file    Past Surgical History  Procedure Laterality Date  . Lithotripsy    . Abdominal hysterectomy    . Kidney surgery      Family History  Problem Relation Age of Onset  . Hyperlipidemia Mother   . Hypertension Mother   . Depression Mother   . Hyperlipidemia Father   . Hypertension Father   . Heart disease Father   . Diabetes Father     Allergies  Allergen Reactions  . Toradol [Ketorolac Tromethamine] Other (See Comments)    Kidney damage-patient has only 1 kidney     No current outpatient prescriptions on file prior to visit.   No current facility-administered medications on file prior to visit.    BP 134/84  Pulse 85  Wt 134 lb (60.782 kg)chart    Objective:   Physical Exam  Constitutional: She is oriented to person, place, and time. She appears well-developed and well-nourished.  Neck: Normal range of motion. Neck supple.  Cardiovascular: Normal rate, regular rhythm and normal heart sounds.   Pulmonary/Chest: Effort normal and breath sounds normal. No respiratory distress.  Abdominal: Soft. Bowel sounds are normal.  Musculoskeletal: Normal range of motion.  Back pain at 7, on scale of 1-10.  Neurological: She is alert and oriented to person, place, and time.  Skin: Skin is warm and dry.          Assessment & Plan:   Alexandra Navarro was seen today for follow-up.  Diagnoses and associated orders for this visit:  Unspecified essential hypertension - CMP  Recurrent kidney stones - POCT urinalysis dipstick - Ambulatory referral to Pain Clinic - Urine culture  Other Orders - Rivaroxaban (XARELTO) 20 MG TABS tablet; Take 1 tablet (20 mg total) by mouth daily with supper. - omeprazole (PRILOSEC) 20 MG capsule; Take 1 capsule (20 mg total) by mouth daily. - cyclobenzaprine (FLEXERIL) 10 MG  tablet; Take 1 tablet (10 mg total) by mouth 3 (three) times daily as needed for muscle spasms. - lisinopril (PRINIVIL,ZESTRIL) 40 MG tablet; Take 1 tablet (40 mg total) by mouth daily. - HYDROcodone-acetaminophen (NORCO) 10-325 MG per tablet; Take 1 tablet by mouth every 8 (eight) hours as needed.    1. Continue current medications as scheduled. 2.Obtain labs that include kidney function and UA.  3. Norco for pain PRN.  4. Patient to follow-up with  [facility] 5.Referral for pain clinic.

## 2014-03-18 NOTE — Progress Notes (Signed)
Pre visit review using our clinic review tool, if applicable. No additional management support is needed unless otherwise documented below in the visit note. 

## 2014-03-21 ENCOUNTER — Telehealth: Payer: Self-pay

## 2014-03-21 ENCOUNTER — Encounter: Payer: Self-pay | Admitting: Family

## 2014-03-21 ENCOUNTER — Telehealth: Payer: Self-pay | Admitting: Family

## 2014-03-21 LAB — URINE CULTURE: Colony Count: >=100000

## 2014-03-21 MED ORDER — SULFAMETHOXAZOLE-TMP DS 800-160 MG PO TABS: 1.0000 | ORAL_TABLET | Freq: Two times a day (BID) | ORAL | Status: DC

## 2014-03-21 NOTE — Telephone Encounter (Signed)
Pt aware and abx sent

## 2014-03-21 NOTE — Telephone Encounter (Signed)
Relevant patient education mailed to patient.  

## 2014-03-21 NOTE — Telephone Encounter (Signed)
Message copied by Beverely LowFRAZIER, Dustin Bumbaugh L on Mon Mar 21, 2014  2:25 PM ------      Message from: [place], [place]      Created: Fri Mar 18, 2014  4:21 PM       Positive blood consistent with possible stone ------

## 2014-04-04 ENCOUNTER — Encounter: Payer: Self-pay | Admitting: General Practice

## 2014-04-07 ENCOUNTER — Telehealth: Payer: Self-pay

## 2014-04-07 ENCOUNTER — Ambulatory Visit: Payer: Medicare Other | Admitting: Family

## 2014-04-07 NOTE — Telephone Encounter (Signed)
Spoke with pt concerning her appointment today for ? Kidney stones. Pt and Padonda already know that pt has a kidney stone. Pt wanted to be seen to see if Oran Reinadonda would refill her Hydrocodone Rx until she sees pain clinic on May 7. [facility] not [facility] another [facility]. Pt's appointment canceled

## 2014-04-29 ENCOUNTER — Other Ambulatory Visit: Payer: Self-pay | Admitting: Urology

## 2014-04-29 DIAGNOSIS — N1330 Unspecified hydronephrosis: Secondary | ICD-10-CM

## 2014-05-09 ENCOUNTER — Other Ambulatory Visit: Payer: Self-pay | Admitting: Pain Medicine

## 2014-05-09 DIAGNOSIS — M545 Low back pain, unspecified: Secondary | ICD-10-CM

## 2014-05-11 ENCOUNTER — Other Ambulatory Visit: Payer: Medicare Other

## 2014-05-27 ENCOUNTER — Ambulatory Visit (HOSPITAL_COMMUNITY)
Admission: RE | Admit: 2014-05-27 | Discharge: 2014-05-27 | Disposition: A | Discharge status: Home / Self Care | Payer: Medicare Other | Source: Ambulatory Visit | Attending: Urology | Admitting: Urology

## 2014-05-27 DIAGNOSIS — N1330 Unspecified hydronephrosis: Secondary | ICD-10-CM | POA: Insufficient documentation

## 2014-05-27 MED ORDER — FUROSEMIDE 10 MG/ML IJ SOLN
40.0000 mg | Freq: Once | INTRAMUSCULAR | Status: AC
  Administered 2014-05-27: 30 mg via INTRAVENOUS
  Filled 2014-05-27: qty 4

## 2014-05-27 MED ORDER — TECHNETIUM TC 99M MERTIATIDE
16.0000 | Freq: Once | INTRAVENOUS | Status: AC | PRN
  Administered 2014-05-27: 16 via INTRAVENOUS

## 2014-05-30 ENCOUNTER — Ambulatory Visit (INDEPENDENT_AMBULATORY_CARE_PROVIDER_SITE_OTHER): Payer: Medicare Other | Admitting: Gastroenterology

## 2014-05-30 ENCOUNTER — Other Ambulatory Visit (INDEPENDENT_AMBULATORY_CARE_PROVIDER_SITE_OTHER): Payer: Medicare Other

## 2014-05-30 ENCOUNTER — Encounter: Payer: Self-pay | Admitting: Gastroenterology

## 2014-05-30 VITALS — BP 120/74 | HR 80 | Ht 62.0 in | Wt 132.4 lb

## 2014-05-30 DIAGNOSIS — K219 Gastro-esophageal reflux disease without esophagitis: Secondary | ICD-10-CM

## 2014-05-30 DIAGNOSIS — R932 Abnormal findings on diagnostic imaging of liver and biliary tract: Secondary | ICD-10-CM

## 2014-05-30 LAB — HEPATIC FUNCTION PANEL
ALBUMIN: 4.3 g/dL (ref 3.5–5.2)
ALT: 14 U/L (ref 0–35)
AST: 22 U/L (ref 0–37)
Alkaline Phosphatase: 49 U/L (ref 39–117)
Bilirubin, Direct: 0.1 mg/dL (ref 0.0–0.3)
TOTAL PROTEIN: 7 g/dL (ref 6.0–8.3)
Total Bilirubin: 0.4 mg/dL (ref 0.2–1.2)

## 2014-05-30 LAB — BASIC METABOLIC PANEL
BUN: 19 mg/dL (ref 6–23)
CALCIUM: 9.3 mg/dL (ref 8.4–10.5)
CO2: 31 meq/L (ref 19–32)
CREATININE: 1.2 mg/dL (ref 0.4–1.2)
Chloride: 103 mEq/L (ref 96–112)
GFR: 48.81 mL/min — ABNORMAL LOW (ref >60.00)
GLUCOSE: 90 mg/dL (ref 70–99)
Potassium: 4.7 mEq/L (ref 3.5–5.1)
Sodium: 139 mEq/L (ref 135–145)

## 2014-05-30 NOTE — Progress Notes (Signed)
    History of Present Illness: This is a 53 year old female underwent evaluation for kidney stone with a non contrasted abdominal/pelvic CT which revealed a dilated common bile duct at 1.5 cm and mildly dilated intrahepatic ducts. No other gastrointestinal abnormalities were noted. LFTs performed in March were normal. No GI symptoms. She states this same problem was evaluated 2 [place] ago in [place] and nothing was found. Denies weight loss, abdominal pain, constipation, diarrhea, change in stool caliber, melena, hematochezia, nausea, vomiting, dysphagia, reflux symptoms, chest pain.  Review of Systems: Pertinent positive and negative review of systems were noted in the above HPI section. All other review of systems were otherwise negative.  Current Medications, Allergies, Past Medical History, Past Surgical History, Family History and Social History were reviewed in [facility] electronic medical record.  Physical Exam: General: Well developed , well nourished, no acute distress Head: Normocephalic and atraumatic Eyes:  sclerae anicteric, EOMI Ears: Normal auditory acuity Mouth: No deformity or lesions Neck: Supple, no masses or thyromegaly Lungs: Clear throughout to auscultation Heart: Regular rate and rhythm; no murmurs, rubs or bruits Abdomen: Soft, non tender and non distended. No masses, hepatosplenomegaly or hernias noted. Normal Bowel sounds Musculoskeletal: Symmetrical with no gross deformities  Skin: No lesions on visible extremities Pulses:  Normal pulses noted Extremities: No clubbing, cyanosis, edema or deformities noted Neurological: Alert oriented x 4, grossly nonfocal Cervical Nodes:  No significant cervical adenopathy Inguinal Nodes: No significant inguinal adenopathy Psychological:  Alert and cooperative. Normal mood and affect  Assessment and Recommendations:  1. Dilated bile duct. Etiology unclear. Rule out pancreatic lesion, biliary stricture or CBD stone.  Repeat LFTs. Schedule MRI/MRCP for further evaluation. Attempt to obtain records from prior evaluation in [place].  2. Colorectal cancer screening, average risk. Will discuss elective colonoscopy after problem #1 has been fully evaluated.  3GERD. Well controlled on omeprazole 20 mg daily and antireflux measures.

## 2014-05-30 NOTE — Patient Instructions (Addendum)
You have been given a separate informational sheet regarding your tobacco use, the importance of quitting and local resources to help you quit.  Your physician has requested that you go to the basement for the following lab work before leaving today:Bmet, LFT's.   You have been scheduled for an [facility]. Your appointment time is 12:00pm. Please arrive 15 minutes prior to your appointment time for registration purposes. Please make certain not to have anything to eat or drink 6 hours prior to your test. In addition, if you have any metal in your body, have a pacemaker or defibrillator, please be sure to let your ordering physician know. This test typically takes 45 minutes to 1 hour to complete.  Thank you for choosing me and [facility].  Venita LickMalcolm T. Pleas KochStark, Jr., MD., Clementeen GrahamFACG

## 2014-06-04 ENCOUNTER — Encounter (HOSPITAL_COMMUNITY): Payer: Self-pay | Admitting: Emergency Medicine

## 2014-06-04 ENCOUNTER — Emergency Department (HOSPITAL_COMMUNITY)
Admission: EM | Admit: 2014-06-04 | Discharge: 2014-06-05 | Discharge status: Against Medical Advice | Payer: Medicare Other | Attending: Emergency Medicine | Admitting: Emergency Medicine

## 2014-06-04 DIAGNOSIS — I10 Essential (primary) hypertension: Secondary | ICD-10-CM | POA: Diagnosis not present

## 2014-06-04 DIAGNOSIS — Z87442 Personal history of urinary calculi: Secondary | ICD-10-CM | POA: Insufficient documentation

## 2014-06-04 DIAGNOSIS — M79609 Pain in unspecified limb: Secondary | ICD-10-CM | POA: Insufficient documentation

## 2014-06-04 DIAGNOSIS — F17200 Nicotine dependence, unspecified, uncomplicated: Secondary | ICD-10-CM | POA: Insufficient documentation

## 2014-06-04 DIAGNOSIS — Z86718 Personal history of other venous thrombosis and embolism: Secondary | ICD-10-CM | POA: Diagnosis not present

## 2014-06-04 NOTE — ED Notes (Signed)
Pt c/o L leg cramping onset 520 am, pt states she has hx of dvt in same extremity with decreased blood flow. Strong pedal pulse noted.

## 2014-06-05 NOTE — ED Notes (Signed)
Multiple attempts made to find patient, unable to locate at this time

## 2014-06-06 ENCOUNTER — Emergency Department (HOSPITAL_COMMUNITY)
Admission: EM | Admit: 2014-06-06 | Discharge: 2014-06-06 | Disposition: A | Discharge status: Home / Self Care | Payer: Medicare Other | Attending: Emergency Medicine | Admitting: Emergency Medicine

## 2014-06-06 ENCOUNTER — Encounter (HOSPITAL_COMMUNITY): Payer: Self-pay | Admitting: Emergency Medicine

## 2014-06-06 DIAGNOSIS — K259 Gastric ulcer, unspecified as acute or chronic, without hemorrhage or perforation: Secondary | ICD-10-CM | POA: Diagnosis not present

## 2014-06-06 DIAGNOSIS — Z87442 Personal history of urinary calculi: Secondary | ICD-10-CM | POA: Insufficient documentation

## 2014-06-06 DIAGNOSIS — Z862 Personal history of diseases of the blood and blood-forming organs and certain disorders involving the immune mechanism: Secondary | ICD-10-CM | POA: Diagnosis not present

## 2014-06-06 DIAGNOSIS — Z8639 Personal history of other endocrine, nutritional and metabolic disease: Secondary | ICD-10-CM | POA: Insufficient documentation

## 2014-06-06 DIAGNOSIS — Z8659 Personal history of other mental and behavioral disorders: Secondary | ICD-10-CM | POA: Diagnosis not present

## 2014-06-06 DIAGNOSIS — Z79899 Other long term (current) drug therapy: Secondary | ICD-10-CM | POA: Insufficient documentation

## 2014-06-06 DIAGNOSIS — F17200 Nicotine dependence, unspecified, uncomplicated: Secondary | ICD-10-CM | POA: Diagnosis not present

## 2014-06-06 DIAGNOSIS — R252 Cramp and spasm: Secondary | ICD-10-CM | POA: Insufficient documentation

## 2014-06-06 DIAGNOSIS — K219 Gastro-esophageal reflux disease without esophagitis: Secondary | ICD-10-CM | POA: Insufficient documentation

## 2014-06-06 DIAGNOSIS — Z7901 Long term (current) use of anticoagulants: Secondary | ICD-10-CM | POA: Insufficient documentation

## 2014-06-06 DIAGNOSIS — M79609 Pain in unspecified limb: Secondary | ICD-10-CM | POA: Diagnosis present

## 2014-06-06 DIAGNOSIS — Z8739 Personal history of other diseases of the musculoskeletal system and connective tissue: Secondary | ICD-10-CM | POA: Diagnosis not present

## 2014-06-06 DIAGNOSIS — I10 Essential (primary) hypertension: Secondary | ICD-10-CM | POA: Diagnosis not present

## 2014-06-06 DIAGNOSIS — Z86718 Personal history of other venous thrombosis and embolism: Secondary | ICD-10-CM | POA: Insufficient documentation

## 2014-06-06 LAB — PROTIME-INR
INR: 2.49 — ABNORMAL HIGH (ref 0.00–1.49)
PROTHROMBIN TIME: 26.9 s — AB (ref 11.6–15.2)

## 2014-06-06 MED ORDER — OXYCODONE-ACETAMINOPHEN 5-325 MG PO TABS
2.0000 | ORAL_TABLET | Freq: Once | ORAL | Status: AC
  Administered 2014-06-06: 2 via ORAL
  Filled 2014-06-06: qty 2

## 2014-06-06 NOTE — ED Provider Notes (Signed)
CSN: [id number]     Arrival date & time 06/06/14  0017 History   First MD Initiated Contact with Patient 06/06/14 [phone]     Chief Complaint  Patient presents with  . 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 She is anticoagulated with warfarin. She presents with complaints of aching pain of the posterior aspect of her left lower leg. This has been going on since around 10 AM yesterday. Patient denies any injury. She says it feels like she has a charley horse. No fever. Patient reports compliance with warfarin. She denies paresthesias and motor weakness.  Patient rates her pain at 8 on a 0-to-10 scale. It is nonradiating.  Of note, the patient has an IVC filter Past Medical History  Diagnosis Date  . DVT (deep venous thrombosis)   . Hypertension   . Kidney stones   . Anxiety   . Arthritis   . Depression   . GERD (gastroesophageal reflux disease)   . Hyperlipidemia   . Gastric ulcer    Past Surgical History  Procedure Laterality Date  . Lithotripsy    . Abdominal hysterectomy    . Kidney surgery     Family History  Problem Relation Age of Onset  . Hyperlipidemia Mother   . Hypertension Mother   . Depression Mother   . Hyperlipidemia Father   . Hypertension Father   . Heart disease Father   . Diabetes Father    History  Substance Use Topics  . Smoking status: Current Every Day Smoker  . Smokeless tobacco: Not on file  . Alcohol Use: No   OB History   Grav Para Term Preterm Abortions TAB SAB Ect Mult Living                 Review of Systems Ten point review of symptoms performed and is negative with the exception of symptoms noted above.    Allergies  Sulfa antibiotics and Toradol  Home Medications   Prior to Admission medications   Medication Sig Start Date End Date  Taking? Authorizing Provider  cyclobenzaprine (FLEXERIL) 10 MG tablet Take 1 tablet (10 mg total) by mouth 3 (three) times daily as needed for muscle spasms. 03/18/14   Baker PieriniPadonda B Campbell, FNP  lisinopril (PRINIVIL,ZESTRIL) 40 MG tablet Take 1 tablet (40 mg total) by mouth daily. 03/18/14   Baker PieriniPadonda B Campbell, FNP  methadone (DOLOPHINE) 10 MG/ML solution Take 63 mg by mouth daily.    Historical Provider, MD  omeprazole (PRILOSEC) 20 MG capsule Take 1 capsule (20 mg total) by mouth daily. 03/18/14   Baker PieriniPadonda B Campbell, FNP  warfarin (COUMADIN) 5 MG tablet Take 5 mg by mouth daily.    Historical Provider, MD   BP 152/90  Pulse 76  Temp(Src) 98 F (36.7 C) (Oral)  Resp 16  SpO2 100% Physical Exam Gen: well developed and well nourished appearing Head: NCAT Eyes: PERL, EOMI Nose: no epistaixis or rhinorrhea Mouth/throat: mucosa is moist and pink Neck: normal to inspection Lungs: CTA B, no wheezing, rhonchi or rales CV: RRR, no murmur, extremities appear well perfused.  Abd: soft, notender, nondistended Back: normal to inspection Skin: warm and dry Ext: left leg is normal to inspection with the exception of fairly diffuse varicosities, there is palpable tension of the left gastrocnemius m. With trigger points and worsening of pain with palpation and with  dorsiflexion of the left foot. Neurovascular exam is wnl - sensation intact to light touch, DP pulses symmetric, cap refill < 2s.  Neuro: CN ii-xii grossly intact, no focal deficits Psyche; tearful affect,  calm and cooperative.   ED Course  Procedures (including critical care time) Labs Review Labs Reviewed  PROTIME-INR - Abnormal; Notable for the following:    Prothrombin Time 26.9 (*)    INR 2.49 (*)    All other components within normal limits      MDM   Muscle spasm of left lower leg. Improved after tx with Percocet in ED. INR is therapeutic. The patient is stable for discharge with plan for outpatient f/u.     Brandt LoosenJulie Manly,  MD 06/06/14 [phone]

## 2014-06-06 NOTE — ED Notes (Signed)
Pt. reports entire left leg pain for 2 days denies injury or fall , ambulatory / respirations unlabored , pedal pulse present .

## 2014-06-06 NOTE — Discharge Instructions (Signed)

## 2014-06-08 ENCOUNTER — Ambulatory Visit (HOSPITAL_COMMUNITY): Payer: Medicare Other

## 2014-06-13 ENCOUNTER — Ambulatory Visit (HOSPITAL_COMMUNITY): Admission: RE | Admit: 2014-06-13 | Payer: Medicare Other | Source: Ambulatory Visit

## 2014-06-21 ENCOUNTER — Other Ambulatory Visit: Payer: Self-pay | Admitting: Gastroenterology

## 2014-06-21 DIAGNOSIS — R932 Abnormal findings on diagnostic imaging of liver and biliary tract: Secondary | ICD-10-CM

## 2014-06-22 ENCOUNTER — Ambulatory Visit (HOSPITAL_COMMUNITY): Admission: RE | Admit: 2014-06-22 | Payer: Medicare Other | Source: Ambulatory Visit

## 2014-06-30 IMAGING — NM NM RENAL IMAGING FLOW W/ PHARM
2 series · 12 of 12 positions shown · non-contrast
Comparison: 03/30/2014

CLINICAL DATA: Evaluate hydronephrosis of the right kidney

EXAM:
NUCLEAR MEDICINE RENAL SCAN WITH DIURETIC ADMINISTRATION
TECHNIQUE: Radionuclide angiographic and sequential renal images were obtained
after intravenous injection of radiopharmaceutical. Imaging was
continued during slow intravenous injection of Lasix approximately
15 minutes after the start of the examination.
RADIOPHARMACEUTICALS:  16.0 mCi Wc-11m MAG3

[Series 1: re renal qualitative · 9.51mm/px · 6 of 130 frames shown (1 of 2)]
[frame 11/130]
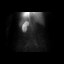
[frame 33/130]
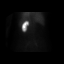
[frame 55/130]
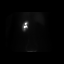
[frame 76/130]
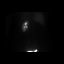
[frame 98/130]
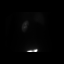
[frame 120/130]
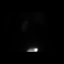

[Series 1: re renal qualitative · 9.51mm/px · 6 of 130 frames shown (2 of 2)]
[frame 11/130]
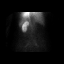
[frame 33/130]
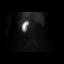
[frame 55/130]
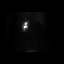
[frame 76/130]
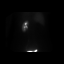
[frame 98/130]
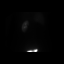
[frame 120/130]
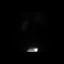

[12 of 12 positions shown; findings below may reference images not displayed]

FINDINGS: Flow: On the flow phase portion of the examination there is normal
perfusion to the left kidney. There is no at perfusion to the right
kidney..

Left renogram: Normal cortical uptake by the left kidney. On the
delayed images there is normal excretion and clearance of the
radiopharmaceutical by the left kidney.

Right renogram: Absent cortical uptake by the right kidney noted.

Differential:

Left kidney = 99.2 %

Right kidney = 0.8 %

T1/2 post Lasix :

Left kidney = 20.5 . Min

Right kidney = not applicable. Min
IMPRESSION: 1. Normally functioning left kidney.
2. Markedly diminished right renal function.
3. The split renal function is equal to 9 9.2% from the left kidney
and 0.8% from the right kidney.

## 2014-07-22 ENCOUNTER — Other Ambulatory Visit: Payer: Self-pay | Admitting: Family

## 2014-07-23 ENCOUNTER — Other Ambulatory Visit: Payer: Self-pay | Admitting: Family

## 2014-11-02 ENCOUNTER — Emergency Department (HOSPITAL_COMMUNITY): Payer: Medicare Other

## 2014-11-02 ENCOUNTER — Emergency Department (HOSPITAL_COMMUNITY)
Admission: EM | Admit: 2014-11-02 | Discharge: 2014-11-02 | Disposition: A | Discharge status: Home / Self Care | Payer: Medicare Other | Attending: Emergency Medicine | Admitting: Emergency Medicine

## 2014-11-02 ENCOUNTER — Encounter (HOSPITAL_COMMUNITY): Payer: Self-pay | Admitting: Emergency Medicine

## 2014-11-02 DIAGNOSIS — Z86718 Personal history of other venous thrombosis and embolism: Secondary | ICD-10-CM | POA: Diagnosis not present

## 2014-11-02 DIAGNOSIS — R0781 Pleurodynia: Secondary | ICD-10-CM

## 2014-11-02 DIAGNOSIS — Z8639 Personal history of other endocrine, nutritional and metabolic disease: Secondary | ICD-10-CM | POA: Diagnosis not present

## 2014-11-02 DIAGNOSIS — Z8739 Personal history of other diseases of the musculoskeletal system and connective tissue: Secondary | ICD-10-CM | POA: Insufficient documentation

## 2014-11-02 DIAGNOSIS — Z8659 Personal history of other mental and behavioral disorders: Secondary | ICD-10-CM | POA: Insufficient documentation

## 2014-11-02 DIAGNOSIS — R0789 Other chest pain: Secondary | ICD-10-CM | POA: Diagnosis not present

## 2014-11-02 DIAGNOSIS — K219 Gastro-esophageal reflux disease without esophagitis: Secondary | ICD-10-CM | POA: Insufficient documentation

## 2014-11-02 DIAGNOSIS — Z7901 Long term (current) use of anticoagulants: Secondary | ICD-10-CM | POA: Diagnosis not present

## 2014-11-02 DIAGNOSIS — Z79899 Other long term (current) drug therapy: Secondary | ICD-10-CM | POA: Insufficient documentation

## 2014-11-02 DIAGNOSIS — Z87442 Personal history of urinary calculi: Secondary | ICD-10-CM | POA: Insufficient documentation

## 2014-11-02 DIAGNOSIS — I10 Essential (primary) hypertension: Secondary | ICD-10-CM | POA: Diagnosis not present

## 2014-11-02 DIAGNOSIS — R079 Chest pain, unspecified: Secondary | ICD-10-CM | POA: Diagnosis present

## 2014-11-02 LAB — CBC WITH DIFFERENTIAL/PLATELET
BASOS PCT: 1 % (ref 0–1)
Basophils Absolute: 0.1 10*3/uL (ref 0.0–0.1)
EOS ABS: 0.2 10*3/uL (ref 0.0–0.7)
EOS PCT: 2 % (ref 0–5)
HCT: 42.1 % (ref 36.0–46.0)
HEMOGLOBIN: 13.7 g/dL (ref 12.0–15.0)
LYMPHS ABS: 2.9 10*3/uL (ref 0.7–4.0)
Lymphocytes Relative: 33 % (ref 12–46)
MCH: 29.8 pg (ref 26.0–34.0)
MCHC: 32.5 g/dL (ref 30.0–36.0)
MCV: 91.5 fL (ref 78.0–100.0)
MONO ABS: 0.7 10*3/uL (ref 0.1–1.0)
MONOS PCT: 8 % (ref 3–12)
NEUTROS PCT: 56 % (ref 43–77)
Neutro Abs: 5.1 10*3/uL (ref 1.7–7.7)
Platelets: 211 10*3/uL (ref 150–400)
RBC: 4.6 MIL/uL (ref 3.87–5.11)
RDW: 13.6 % (ref 11.5–15.5)
WBC: 8.9 10*3/uL (ref 4.0–10.5)

## 2014-11-02 LAB — PROTIME-INR
INR: 1.07 (ref 0.00–1.49)
Prothrombin Time: 14 seconds (ref 11.6–15.2)

## 2014-11-02 LAB — BASIC METABOLIC PANEL
Anion gap: 13 (ref 5–15)
BUN: 21 mg/dL (ref 6–23)
CALCIUM: 9.2 mg/dL (ref 8.4–10.5)
CO2: 23 mEq/L (ref 19–32)
CREATININE: 1.16 mg/dL — AB (ref 0.50–1.10)
Chloride: 104 mEq/L (ref 96–112)
GFR, EST AFRICAN AMERICAN: 61 mL/min — AB (ref >90)
GFR, EST NON AFRICAN AMERICAN: 52 mL/min — AB (ref >90)
GLUCOSE: 86 mg/dL (ref 70–99)
Potassium: 4.9 mEq/L (ref 3.7–5.3)
Sodium: 140 mEq/L (ref 137–147)

## 2014-11-02 MED ORDER — TECHNETIUM TC 99M DIETHYLENETRIAME-PENTAACETIC ACID: 40.0000 | Freq: Once | INTRAVENOUS | Status: DC | PRN

## 2014-11-02 MED ORDER — WARFARIN SODIUM 5 MG PO TABS
5.0000 mg | ORAL_TABLET | Freq: Once | ORAL | Status: AC
  Administered 2014-11-02: 5 mg via ORAL
  Filled 2014-11-02: qty 1

## 2014-11-02 MED ORDER — OXYCODONE-ACETAMINOPHEN 5-325 MG PO TABS: 1.0000 | ORAL_TABLET | Freq: Four times a day (QID) | ORAL | Status: DC | PRN

## 2014-11-02 MED ORDER — OXYCODONE-ACETAMINOPHEN 5-325 MG PO TABS
2.0000 | ORAL_TABLET | Freq: Once | ORAL | Status: AC
  Administered 2014-11-02: 2 via ORAL
  Filled 2014-11-02: qty 2

## 2014-11-02 MED ORDER — TECHNETIUM TO 99M ALBUMIN AGGREGATED
5.4000 | Freq: Once | INTRAVENOUS | Status: AC | PRN
Start: 2014-11-02 — End: 2014-11-02
  Administered 2014-11-02: 5 via INTRAVENOUS

## 2014-11-02 MED ORDER — WARFARIN SODIUM 2.5 MG PO TABS
2.5000 mg | ORAL_TABLET | Freq: Once | ORAL | Status: AC
  Administered 2014-11-02: 2.5 mg via ORAL
  Filled 2014-11-02: qty 1

## 2014-11-02 NOTE — ED Provider Notes (Signed)
CSN: [id number]     Arrival date & time 11/02/14  1203 History   First MD Initiated Contact with Patient 11/02/14 1219     Chief Complaint  Patient presents with  . Flank Pain     (Consider location/radiation/quality/duration/timing/severity/associated sxs/prior Treatment) HPI  Pt is a 53yo female with hx of DVT, HTN, kidney stones, anxiety, depression, arthritis, GERD, hyperlipidemia and gastric ulcer, presenting to ED with c/o left sided chest wall pain x3 days. Pt states she thinks she pulled a muscle or tendon after carrying in a large amount of groceries the other day. Denies fall or direct trauma to chest wall. Pain is aching, and sharp on occasion with certain movements, deep breathing and palpation over ribs.  States she took percocet she had left over from previous leg pain. This did help relief her pain. Denies SOB. Denies fever, chills, n/v/d. Denies any rash, redness, or bruising to area of pain.  Pt also c/o rash around wrists and ankles that is erythematous and pruritic. Rash has been present for over 3 months. She has been given prednisone by her PCP which did help the rash improve some but it never completely resolved. Pt initially believed it was poison ivy. No known allergies. No other sick contacts with similar rash.   Past Medical History  Diagnosis Date  . DVT (deep venous thrombosis)   . Hypertension   . Kidney stones   . Anxiety   . Arthritis   . Depression   . GERD (gastroesophageal reflux disease)   . Hyperlipidemia   . Gastric ulcer    Past Surgical History  Procedure Laterality Date  . Lithotripsy    . Abdominal hysterectomy    . Kidney surgery     Family History  Problem Relation Age of Onset  . Hyperlipidemia Mother   . Hypertension Mother   . Depression Mother   . Hyperlipidemia Father   . Hypertension Father   . Heart disease Father   . Diabetes Father    History  Substance Use Topics  . Smoking status: Current Every Day Smoker  .  Smokeless tobacco: Not on file  . Alcohol Use: No   OB History    No data available     Review of Systems  Constitutional: Negative for fever and chills.  Respiratory: Negative for cough and shortness of breath.   Cardiovascular: Positive for chest pain ( left chest wall). Negative for palpitations and leg swelling.  Gastrointestinal: Negative for nausea, vomiting, abdominal pain and diarrhea.  Musculoskeletal: Positive for myalgias. Negative for back pain.  All other systems reviewed and are negative.     Allergies  Sulfa antibiotics and Toradol  Home Medications   Prior to Admission medications   Medication Sig Start Date End Date Taking? Authorizing Provider  cyclobenzaprine (FLEXERIL) 10 MG tablet Take 1 tablet (10 mg total) by mouth 3 (three) times daily as needed for muscle spasms. 03/18/14  Yes Baker PieriniPadonda B Campbell, FNP  lisinopril (PRINIVIL,ZESTRIL) 40 MG tablet Take 1 tablet (40 mg total) by mouth daily. Patient taking differently: Take 40 mg by mouth at bedtime.  03/18/14  Yes Baker PieriniPadonda B Campbell, FNP  methadone (DOLOPHINE) 10 MG/ML solution Take 100 mg by mouth daily.    Yes Historical Provider, MD  omeprazole (PRILOSEC) 20 MG capsule Take 1 capsule (20 mg total) by mouth daily. Patient taking differently: Take 20 mg by mouth at bedtime.  03/18/14  Yes Baker PieriniPadonda B Campbell, FNP  warfarin (COUMADIN) 5 MG tablet Take  5 mg by mouth daily.   Yes Historical Provider, MD   BP 153/83 mmHg  Pulse 100  Temp(Src) 97.8 F (36.6 C) (Oral)  Resp 16  SpO2 100% Physical Exam  Constitutional: She appears well-developed and well-nourished. No distress.  HENT:  Head: Normocephalic and atraumatic.  Eyes: Conjunctivae are normal. No scleral icterus.  Neck: Normal range of motion.  Cardiovascular: Normal rate, regular rhythm and normal heart sounds.   Pulmonary/Chest: Effort normal and breath sounds normal. No respiratory distress. She has no wheezes. She has no rales. She exhibits  tenderness.    Reproducible chest wall tenderness to Left side of chest over ribs 3-5. No crepitus. No flail chest. Lungs: CTAB. No respiratory distress.  Abdominal: Soft. Bowel sounds are normal. She exhibits no distension and no mass. There is no tenderness. There is no rebound and no guarding.  Musculoskeletal: Normal range of motion.  Neurological: She is alert.  Skin: Skin is warm and dry. Rash noted. She is not diaphoretic. No erythema.  No erythema, ecchymosis, or lesions on chest wall where pain is.  Erythematous dry scaling rash on wrists and ankles. Few spots between web spaces of fingers but no tracking lines.   Nursing note and vitals reviewed.   ED Course  Procedures (including critical care time) Labs Review Labs Reviewed - No data to display  Imaging Review No results found.   EKG Interpretation None      MDM   Final diagnoses:  Left-sided chest wall pain    Pt presenting to ED with c/o left sided chest wall pain after lifting groceries 3 days ago.  Pain is reproducible with palpation. No evidence of shingles.  Pain likely musculoskeletal in nature. Will get plain films of ribs to ensure no rib fracture or bony lesion. Doubt ACS or PE. Pt is on warfarin. Denies SOB. O2-100% on RA.   Rash on wrists and ankles, chronic, non-specific. Not concerned for allergic reaction. Will offer pt steroid cream to help with pruritic nature of rash.  1:14 PM Pt signed out to [employer]annah Merrell, PA-C at shift change.  Plain films pending. Pt may be discharged home with pain medication if unremarkable and vitals remain stable. F/u with PCP.    Junius Finnerrin O'Malley, PA-C 11/02/14 1315  Arby BarretteMarcy Pfeiffer, MD 11/03/14 [phone]

## 2014-11-02 NOTE — ED Notes (Signed)
Patient transported to NM 

## 2014-11-02 NOTE — ED Notes (Signed)
Pt states that she has been having LUQ/lt flank pain x 3 days.  Denies NVD.  States that she thinks it is related to carrying groceries in the house.

## 2014-11-02 NOTE — ED Provider Notes (Signed)
4:00 PM Patient signed out to me by Junious SilkHannah Merrell, PA-C at shift change.  Patient with a history of PE x 6 presents today with reproducible pleuritic chest pain.  She does have a IVC filter in place and is currently on Coumadin.  Plan is for the patient to have a VQ scan to rule out a PE and discharge home with Percocet if negative.  7:18 PM VQ scan showing low probability of PE.  Discussed results with the patient.  Patient also informed that her Coumadin is subtherapeutic.  Patient stable for discharge.  Return precautions given.  Santiago GladHeather Grabiela Wohlford, PA-C 11/02/14 2351  Audree CamelScott T Goldston, MD 11/03/14 [phone]

## 2014-11-02 NOTE — ED Provider Notes (Signed)
Patient is a 53 year old female with history of DVT, clotting disorder, chronic anticoagulation, hypertension, hyperlipidemia, kidney stones who presents to the ED today for evaluation of left sided chest pain. Pain began 3 days ago. Began the day after lifting heavy groceries. No noticeable injury. DVT x 6 with IVC filter in place due to location of DVT. She gets INR measured monthly, last measured one month ago. Today INR is sub therapeutic. Pain is likely MSK, although cannot rule out PE given this history. Will get VQ scan given 1 functioning kidney. VQ pending. Patient signed out to [employer]ure, PA-C at change of shift. Vital signs stable at this time. Dr. Donnald GarrePfeiffer evaluated patient and agrees with plan.   Results for orders placed or performed during the hospital encounter of 11/02/14  CBC with Differential  Result Value Ref Range   WBC 8.9 4.0 - 10.5 K/uL   RBC 4.60 3.87 - 5.11 MIL/uL   Hemoglobin 13.7 12.0 - 15.0 g/dL   HCT 16.142.1 09.636.0 - 04.546.0 %   MCV 91.5 78.0 - 100.0 fL   MCH 29.8 26.0 - 34.0 pg   MCHC 32.5 30.0 - 36.0 g/dL   RDW 40.913.6 81.111.5 - 91.415.5 %   Platelets 211 150 - 400 K/uL   Neutrophils Relative % 56 43 - 77 %   Neutro Abs 5.1 1.7 - 7.7 K/uL   Lymphocytes Relative 33 12 - 46 %   Lymphs Abs 2.9 0.7 - 4.0 K/uL   Monocytes Relative 8 3 - 12 %   Monocytes Absolute 0.7 0.1 - 1.0 K/uL   Eosinophils Relative 2 0 - 5 %   Eosinophils Absolute 0.2 0.0 - 0.7 K/uL   Basophils Relative 1 0 - 1 %   Basophils Absolute 0.1 0.0 - 0.1 K/uL  Basic metabolic panel  Result Value Ref Range   Sodium 140 137 - 147 mEq/L   Potassium 4.9 3.7 - 5.3 mEq/L   Chloride 104 96 - 112 mEq/L   CO2 23 19 - 32 mEq/L   Glucose, Bld 86 70 - 99 mg/dL   BUN 21 6 - 23 mg/dL   Creatinine, Ser 7.821.16 (H) 0.50 - 1.10 mg/dL   Calcium 9.2 8.4 - 95.610.5 mg/dL   GFR calc non Af Amer 52 (L) >90 mL/min   GFR calc Af Amer 61 (L) >90 mL/min   Anion gap 13 5 - 15  Protime-INR  Result Value Ref Range   Prothrombin Time 14.0  11.6 - 15.2 seconds   INR 1.07 0.00 - 1.49   Dg Ribs Unilateral W/chest Left  11/02/2014   CLINICAL DATA:  Left-sided chest wall pain.  EXAM: LEFT RIBS AND CHEST - 3+ VIEW  COMPARISON:  None.  FINDINGS: No fracture or other bone lesions are seen involving the ribs. There is no evidence of pneumothorax or pleural effusion. Both lungs are clear. Heart size and mediastinal contours are within normal limits.  IMPRESSION: Normal left ribs.   Electronically Signed   By: Roque LiasJames  Green M.D.   On: 11/02/2014 13:38     Mora BellmanHannah S Edis Huish, PA-C 11/02/14 1613  Arby BarretteMarcy Pfeiffer, MD 11/03/14 [phone]

## 2014-11-02 NOTE — ED Notes (Signed)
Pt refused vitals and walked her to the d/c window with 1 prescrips

## 2014-11-25 ENCOUNTER — Encounter (HOSPITAL_COMMUNITY): Payer: Self-pay

## 2014-11-25 ENCOUNTER — Emergency Department (HOSPITAL_COMMUNITY)
Admission: EM | Admit: 2014-11-25 | Discharge: 2014-11-25 | Disposition: A | Discharge status: Home / Self Care | Payer: Medicare Other | Attending: Emergency Medicine | Admitting: Emergency Medicine

## 2014-11-25 DIAGNOSIS — Z8659 Personal history of other mental and behavioral disorders: Secondary | ICD-10-CM | POA: Diagnosis not present

## 2014-11-25 DIAGNOSIS — Z720 Tobacco use: Secondary | ICD-10-CM | POA: Diagnosis not present

## 2014-11-25 DIAGNOSIS — Z86718 Personal history of other venous thrombosis and embolism: Secondary | ICD-10-CM | POA: Insufficient documentation

## 2014-11-25 DIAGNOSIS — R21 Rash and other nonspecific skin eruption: Secondary | ICD-10-CM | POA: Diagnosis present

## 2014-11-25 DIAGNOSIS — K219 Gastro-esophageal reflux disease without esophagitis: Secondary | ICD-10-CM | POA: Diagnosis not present

## 2014-11-25 DIAGNOSIS — Z872 Personal history of diseases of the skin and subcutaneous tissue: Secondary | ICD-10-CM | POA: Diagnosis not present

## 2014-11-25 DIAGNOSIS — Z87442 Personal history of urinary calculi: Secondary | ICD-10-CM | POA: Insufficient documentation

## 2014-11-25 DIAGNOSIS — Z8639 Personal history of other endocrine, nutritional and metabolic disease: Secondary | ICD-10-CM | POA: Insufficient documentation

## 2014-11-25 DIAGNOSIS — I10 Essential (primary) hypertension: Secondary | ICD-10-CM | POA: Insufficient documentation

## 2014-11-25 MED ORDER — OXYCODONE-ACETAMINOPHEN 5-325 MG PO TABS: 1.0000 | ORAL_TABLET | Freq: Four times a day (QID) | ORAL | Status: AC | PRN

## 2014-11-25 NOTE — ED Provider Notes (Signed)
CSN: [id number]     Arrival date & time 11/25/14  1613 History  This chart was scribed for Fayrene HelperBowie Tanvi Gatling, PA-C, working with Purvis SheffieldForrest Harrison, MD found by Elon SpannerGarrett Cook, ED Scribe. This patient was seen in room WTR6/WTR6 and the patient's care was started at 4:56 PM.   Chief Complaint  Patient presents with  . Leg Pain  . Rash   HPI HPI Comments: Alexandra Navarro is a 53 y.o. female who presents to the Emergency Department complaining of an itching rash on her bilateral feet and hands onset several months ago with recent bilateral leg pain.  Patient reports being seen for this complaint before and being diagnosed with poison ivy.  She has used prescribed topical cream and a course of steroids without relief.  Patient reports she lives with her son, daughter-in-law and grandson; non of whom have a rash. NO headache.    Patient denies history of STD.  Patient denies fever, abdominal cramping.  No hx of Syphilis.  NO one else at home with similar rash.  No other complaints.  Denies direct exposure to poison Ivy.     Past Medical History  Diagnosis Date  . DVT (deep venous thrombosis)   . Hypertension   . Kidney stones   . Anxiety   . Arthritis   . Depression   . GERD (gastroesophageal reflux disease)   . Hyperlipidemia   . Gastric ulcer    Past Surgical History  Procedure Laterality Date  . Lithotripsy    . Abdominal hysterectomy    . Kidney surgery     Family History  Problem Relation Age of Onset  . Hyperlipidemia Mother   . Hypertension Mother   . Depression Mother   . Hyperlipidemia Father   . Hypertension Father   . Heart disease Father   . Diabetes Father    History  Substance Use Topics  . Smoking status: Current Every Day Smoker  . Smokeless tobacco: Not on file  . Alcohol Use: No   OB History    No data available     Review of Systems  Constitutional: Negative for fever.  Skin: Positive for rash.  Neurological: Negative for headaches.      Allergies   Sulfa antibiotics and Toradol  Home Medications   Prior to Admission medications   Medication Sig Start Date End Date Taking? Authorizing Provider  cyclobenzaprine (FLEXERIL) 10 MG tablet Take 1 tablet (10 mg total) by mouth 3 (three) times daily as needed for muscle spasms. 03/18/14   Baker PieriniPadonda B Campbell, FNP  lisinopril (PRINIVIL,ZESTRIL) 40 MG tablet Take 1 tablet (40 mg total) by mouth daily. Patient taking differently: Take 40 mg by mouth at bedtime.  03/18/14   Baker PieriniPadonda B Campbell, FNP  methadone (DOLOPHINE) 10 MG/ML solution Take 100 mg by mouth daily.     Historical Provider, MD  omeprazole (PRILOSEC) 20 MG capsule Take 1 capsule (20 mg total) by mouth daily. Patient taking differently: Take 20 mg by mouth at bedtime.  03/18/14   Baker PieriniPadonda B Campbell, FNP  oxyCODONE-acetaminophen (PERCOCET/ROXICET) 5-325 MG per tablet Take 1-2 tablets by mouth every 6 (six) hours as needed for severe pain. 11/02/14   Santiago GladHeather Laisure, PA-C  warfarin (COUMADIN) 5 MG tablet Take 5 mg by mouth daily.    Historical Provider, MD   BP 146/67 mmHg  Pulse 95  Temp(Src) 98.8 F (37.1 C) (Oral)  Resp 18  SpO2 94% Physical Exam  Constitutional: She is oriented to person, place, and time.  She appears well-developed and well-nourished. No distress.  HENT:  Head: Normocephalic and atraumatic.  No mucosal involvement.   Eyes: Conjunctivae and EOM are normal.  Neck: Neck supple. No tracheal deviation present.  Cardiovascular: Normal rate, regular rhythm and normal heart sounds.  Exam reveals no gallop and no friction rub.   No murmur heard. Pulmonary/Chest: Effort normal and breath sounds normal. No respiratory distress. She has no wheezes. She has no rales.  Musculoskeletal: Normal range of motion.  Neurological: She is alert and oriented to person, place, and time.  Skin: Skin is warm and dry. Rash noted.   Bilateral foot hyperpigmented plaque-like lesion noted to dorsums of both feet.  Blanchable without pus,  vesicle or petechia.  Pedal pulses are palpable.  Confluent groups of papular lesion noted to bilateral calves.  Multiple papular lesion noted to palmar aspects of bilateral wrist and dorsum of right hand. No vaginal, abdominal, or back rash.   Psychiatric: She has a normal mood and affect. Her behavior is normal.  Nursing note and vitals reviewed.   ED Course  Procedures (including critical care time)  DIAGNOSTIC STUDIES: Oxygen Saturation is 94% on RA, normal by my interpretation.    COORDINATION OF CARE:  5:01 PM Rash of unknown etiology.  No systemic involvement.  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  Will provide referal to dermatologist.  I initially want to prescribe doxycycline but pt on warfarin and it can affect her INR level, therefore pt will f/u with dermatology instead.  She also report having pain, will provide pain medication to use as needed.   Patient acknowledges and agrees with plan.    Labs Review Labs Reviewed - No data to display  Imaging Review No results found.   EKG Interpretation None      MDM   Final diagnoses:  Rash and nonspecific skin eruption    BP 146/67 mmHg  Pulse 95  Temp(Src) 98.8 F (37.1 C) (Oral)  Resp 18  SpO2 94%   I personally performed the services described in this documentation, which was scribed in my presence. The recorded information has been reviewed and is accurate.    Fayrene Helper.   Gavan Nordby, PA-C 11/25/14 1733  Purvis SheffieldForrest Harrison, MD 11/26/14 [phone]

## 2014-11-25 NOTE — ED Notes (Signed)
Per pt, had rash/swelling to left ankle x 3 months.  Pt has taken prednisone and lotion with no relief.  Site is itchy and red with heat.  On rt ankle also but not severe

## 2014-11-25 NOTE — Discharge Instructions (Signed)
Please follow up closely with dermatology for further evaluation of your rash.  Take over the counter benadryl as needed for itch.  Take pain medication if you experienced intense pain.    Rash A rash is a change in the color or texture of your skin. There are many different types of rashes. You may have other problems that accompany your rash. CAUSES   Infections.  Allergic reactions. This can include allergies to pets or foods.  Certain medicines.  Exposure to certain chemicals, soaps, or cosmetics.  Heat.  Exposure to poisonous plants.  Tumors, both cancerous and noncancerous. SYMPTOMS   Redness.  Scaly skin.  Itchy skin.  Dry or cracked skin.  Bumps.  Blisters.  Pain. DIAGNOSIS  Your caregiver may do a physical exam to determine what type of rash you have. A skin sample (biopsy) may be taken and examined under a microscope. TREATMENT  Treatment depends on the type of rash you have. Your caregiver may prescribe certain medicines. For serious conditions, you may need to see a skin doctor (dermatologist). HOME CARE INSTRUCTIONS   Avoid the substance that caused your rash.  Do not scratch your rash. This can cause infection.  You may take cool baths to help stop itching.  Only take over-the-counter or prescription medicines as directed by your caregiver.  Keep all follow-up appointments as directed by your caregiver. SEEK IMMEDIATE MEDICAL CARE IF:  You have increasing pain, swelling, or redness.  You have a fever.  You have new or severe symptoms.  You have body aches, diarrhea, or vomiting.  Your rash is not better after 3 days. MAKE SURE YOU:  Understand these instructions.  Will watch your condition.  Will get help right away if you are not doing well or get worse. Document Released: 11/15/2002 Document Revised: 02/17/2012 Document Reviewed: 09/09/2011 [facility] Patient Information 2015 [employer] [place]. This information is not intended to replace  advice given to you by your health care provider. Make sure you discuss any questions you have with your health care provider.

## 2015-03-09 ENCOUNTER — Encounter (HOSPITAL_COMMUNITY): Payer: Self-pay | Admitting: Emergency Medicine

## 2015-03-09 ENCOUNTER — Emergency Department (HOSPITAL_COMMUNITY)
Admission: EM | Admit: 2015-03-09 | Discharge: 2015-03-09 | Disposition: A | Discharge status: Home / Self Care | Payer: Medicare Other | Attending: Emergency Medicine | Admitting: Emergency Medicine

## 2015-03-09 DIAGNOSIS — Z720 Tobacco use: Secondary | ICD-10-CM | POA: Diagnosis not present

## 2015-03-09 DIAGNOSIS — M79662 Pain in left lower leg: Secondary | ICD-10-CM | POA: Insufficient documentation

## 2015-03-09 DIAGNOSIS — Z79899 Other long term (current) drug therapy: Secondary | ICD-10-CM | POA: Diagnosis not present

## 2015-03-09 DIAGNOSIS — M1990 Unspecified osteoarthritis, unspecified site: Secondary | ICD-10-CM | POA: Diagnosis not present

## 2015-03-09 DIAGNOSIS — Z87442 Personal history of urinary calculi: Secondary | ICD-10-CM | POA: Diagnosis not present

## 2015-03-09 DIAGNOSIS — K219 Gastro-esophageal reflux disease without esophagitis: Secondary | ICD-10-CM | POA: Diagnosis not present

## 2015-03-09 DIAGNOSIS — Z7901 Long term (current) use of anticoagulants: Secondary | ICD-10-CM | POA: Diagnosis not present

## 2015-03-09 DIAGNOSIS — Z86718 Personal history of other venous thrombosis and embolism: Secondary | ICD-10-CM | POA: Diagnosis not present

## 2015-03-09 DIAGNOSIS — I10 Essential (primary) hypertension: Secondary | ICD-10-CM | POA: Diagnosis not present

## 2015-03-09 DIAGNOSIS — Z8639 Personal history of other endocrine, nutritional and metabolic disease: Secondary | ICD-10-CM | POA: Insufficient documentation

## 2015-03-09 LAB — PROTIME-INR
INR: 1.28 (ref 0.00–1.49)
Prothrombin Time: 16.1 seconds — ABNORMAL HIGH (ref 11.6–15.2)

## 2015-03-09 MED ORDER — OXYCODONE-ACETAMINOPHEN 5-325 MG PO TABS
2.0000 | ORAL_TABLET | Freq: Once | ORAL | Status: AC
  Administered 2015-03-09: 2 via ORAL
  Filled 2015-03-09: qty 2

## 2015-03-09 MED ORDER — ENOXAPARIN SODIUM 80 MG/0.8ML ~~LOC~~ SOLN
1.0000 mg/kg | Freq: Once | SUBCUTANEOUS | Status: AC
  Administered 2015-03-09: 65 mg via SUBCUTANEOUS
  Filled 2015-03-09: qty 0.8

## 2015-03-09 NOTE — ED Notes (Signed)
Pt c/o hx of DVT. Pt has been traveling a lot lately and is now c/o L leg pain similar to previous DVTs in same leg. Pt c/o swelling, no redness. Pt has positive homan's sign. A&Ox4. Ambulatory with pain.

## 2015-03-09 NOTE — ED Provider Notes (Signed)
CSN: [id number]     Arrival date & time 03/09/15  1835 History   First MD Initiated Contact with Patient 03/09/15 2157     Chief Complaint  Patient presents with  . Leg Pain  . DVT     (Consider location/radiation/quality/duration/timing/severity/associated sxs/prior Treatment) HPI  This is a [age] year old female with a past medical history of coagulopathy. Patient complains of worsening left lower extremity pain, swelling. Patient states her physical previous DVTs. She's had within 6 DVTs. Patient has a filter in place. She is also currently taking Coumadin. Patient states that she frequently gets DVTs and 1. Her INR is therapeutic. She is having significant difficulty ambulating due to pain. Denies any chest pain or shortness of breath Past Medical History  Diagnosis Date  . DVT (deep venous thrombosis)   . Hypertension   . Kidney stones   . Anxiety   . Arthritis   . Depression   . GERD (gastroesophageal reflux disease)   . Hyperlipidemia   . Gastric ulcer    Past Surgical History  Procedure Laterality Date  . Lithotripsy    . Abdominal hysterectomy    . Kidney surgery     Family History  Problem Relation Age of Onset  . Hyperlipidemia Mother   . Hypertension Mother   . Depression Mother   . Hyperlipidemia Father   . Hypertension Father   . Heart disease Father   . Diabetes Father    History  Substance Use Topics  . Smoking status: Current Every Day Smoker  . Smokeless tobacco: Not on file  . Alcohol Use: No   OB History    No data available     Review of Systems  Ten systems reviewed and are negative for acute change, except as noted in the HPI.    Allergies  Sulfa antibiotics and Toradol  Home Medications   Prior to Admission medications   Medication Sig Start Date End Date Taking? Authorizing Provider  cyclobenzaprine (FLEXERIL) 10 MG tablet Take 1 tablet (10 mg total) by mouth 3 (three) times daily as needed for muscle spasms. 03/18/14  Yes Baker PieriniPadonda B  Campbell, FNP  ibuprofen (ADVIL,MOTRIN) 200 MG tablet Take 400 mg by mouth every 6 (six) hours as needed for moderate pain.   Yes Historical Provider, MD  lisinopril (PRINIVIL,ZESTRIL) 40 MG tablet Take 1 tablet (40 mg total) by mouth daily. Patient taking differently: Take 40 mg by mouth at bedtime.  03/18/14  Yes Baker PieriniPadonda B Campbell, FNP  omeprazole (PRILOSEC) 20 MG capsule Take 1 capsule (20 mg total) by mouth daily. Patient taking differently: Take 20 mg by mouth at bedtime.  03/18/14  Yes Baker PieriniPadonda B Campbell, FNP  warfarin (COUMADIN) 5 MG tablet Take 5 mg by mouth daily.   Yes Historical Provider, MD  oxyCODONE-acetaminophen (PERCOCET/ROXICET) 5-325 MG per tablet Take 1 tablet by mouth every 6 (six) hours as needed for severe pain. Patient not taking: Reported on 03/09/2015 11/25/14   Fayrene HelperBowie Tran, PA-C   BP 115/99 mmHg  Pulse 83  Temp(Src) 98.7 F (37.1 C) (Oral)  Resp 18  SpO2 98% Physical Exam  Constitutional: She is oriented to person, place, and time. She appears well-developed and well-nourished. No distress.  HENT:  Head: Normocephalic and atraumatic.  Eyes: Conjunctivae are normal. No scleral icterus.  Neck: Normal range of motion.  Cardiovascular: Normal rate, regular rhythm, normal heart sounds and intact distal pulses.  Exam reveals no gallop and no friction rub.   No murmur heard. Pulmonary/Chest: Effort  normal and breath sounds normal. No respiratory distress.  Abdominal: Soft. Bowel sounds are normal. She exhibits no distension and no mass. There is no tenderness. There is no guarding.  Musculoskeletal:  Left lower extremity with minimal swelling, calf tenderness, dilated superficial varicose veins which are tender to palpation. Distal pulses intact. Foot is warm. No signs of cerulea dolens or alba.  Neurological: She is alert and oriented to person, place, and time.  Skin: Skin is warm and dry. She is not diaphoretic.  Nursing note and vitals reviewed.   ED Course   Procedures (including critical care time) Labs Review Labs Reviewed - No data to display  Imaging Review No results found.   EKG Interpretation None      MDM   Final diagnoses:  Calf pain, left    Awaiting patient's INR. Patient will receive a shot of Lovenox pending. Her INR results. Pain medication given, patient will need crutches.  Patient INR sub therapeutic. Will  Be given Lovenox. Return for DVT study tomorrow.  Arthor Captain, PA-C 03/10/15 1902  Doug Sou, MD 03/11/15 1459

## 2015-03-09 NOTE — Discharge Instructions (Signed)

## 2015-03-09 NOTE — ED Notes (Signed)
Pt states Monday started noticing swelling to L calf, states has a hx of DVT in that leg and this feels the same, states pain and swelling has worsened, no redness noted to calf.

## 2015-03-10 ENCOUNTER — Ambulatory Visit (HOSPITAL_COMMUNITY): Payer: Medicare Other | Attending: Internal Medicine

## 2015-04-03 ENCOUNTER — Other Ambulatory Visit: Payer: Self-pay | Admitting: Family
